# Patient Record
Sex: Female | Born: 1965
Health system: Southern US, Community
[De-identification: ages and names within clinical notes are randomized; demographics above are authoritative.]

## PROBLEM LIST (undated history)

## (undated) DIAGNOSIS — D649 Anemia, unspecified: Secondary | ICD-10-CM

## (undated) DIAGNOSIS — I10 Essential (primary) hypertension: Secondary | ICD-10-CM

## (undated) DIAGNOSIS — E119 Type 2 diabetes mellitus without complications: Secondary | ICD-10-CM

## (undated) DIAGNOSIS — E785 Hyperlipidemia, unspecified: Secondary | ICD-10-CM

## (undated) HISTORY — PX: CARPAL TUNNEL RELEASE: SHX101

## (undated) HISTORY — DX: Anemia, unspecified: D64.9

## (undated) HISTORY — PX: TONSILLECTOMY AND ADENOIDECTOMY: SUR1326

## (undated) HISTORY — DX: Hyperlipidemia, unspecified: E78.5

## (undated) HISTORY — PX: KNEE SURGERY: SHX244

## (undated) HISTORY — PX: MOUTH SURGERY: SHX715

## (undated) HISTORY — PX: BACK SURGERY: SHX140

---

## 1998-04-24 ENCOUNTER — Other Ambulatory Visit: Admission: RE | Admit: 1998-04-24 | Discharge: 1998-04-24 | Payer: Self-pay | Admitting: Obstetrics and Gynecology

## 1998-08-05 ENCOUNTER — Encounter: Admission: RE | Admit: 1998-08-05 | Discharge: 1998-11-03 | Payer: Self-pay | Admitting: Obstetrics and Gynecology

## 1998-12-07 ENCOUNTER — Inpatient Hospital Stay (HOSPITAL_COMMUNITY): Admission: AD | Admit: 1998-12-07 | Discharge: 1998-12-10 | Payer: Self-pay | Admitting: Obstetrics and Gynecology

## 1999-01-22 ENCOUNTER — Other Ambulatory Visit: Admission: RE | Admit: 1999-01-22 | Discharge: 1999-01-22 | Payer: Self-pay | Admitting: Obstetrics & Gynecology

## 2000-06-01 ENCOUNTER — Other Ambulatory Visit: Admission: RE | Admit: 2000-06-01 | Discharge: 2000-06-01 | Payer: Self-pay | Admitting: Obstetrics and Gynecology

## 2001-09-03 ENCOUNTER — Emergency Department (HOSPITAL_COMMUNITY): Admission: EM | Admit: 2001-09-03 | Discharge: 2001-09-03 | Payer: Self-pay

## 2001-12-08 ENCOUNTER — Encounter: Admission: RE | Admit: 2001-12-08 | Discharge: 2001-12-08 | Payer: Self-pay | Admitting: Family Medicine

## 2001-12-08 ENCOUNTER — Encounter: Payer: Self-pay | Admitting: Family Medicine

## 2002-10-19 ENCOUNTER — Other Ambulatory Visit: Admission: RE | Admit: 2002-10-19 | Discharge: 2002-10-19 | Payer: Self-pay | Admitting: Obstetrics and Gynecology

## 2003-03-04 ENCOUNTER — Encounter: Payer: Self-pay | Admitting: Family Medicine

## 2003-03-04 ENCOUNTER — Encounter: Admission: RE | Admit: 2003-03-04 | Discharge: 2003-03-04 | Payer: Self-pay | Admitting: Family Medicine

## 2003-04-03 ENCOUNTER — Encounter: Admission: RE | Admit: 2003-04-03 | Discharge: 2003-04-03 | Payer: Self-pay | Admitting: Family Medicine

## 2003-04-03 ENCOUNTER — Encounter: Payer: Self-pay | Admitting: Family Medicine

## 2003-11-13 ENCOUNTER — Other Ambulatory Visit: Admission: RE | Admit: 2003-11-13 | Discharge: 2003-11-13 | Payer: Self-pay | Admitting: Obstetrics and Gynecology

## 2004-11-24 ENCOUNTER — Other Ambulatory Visit: Admission: RE | Admit: 2004-11-24 | Discharge: 2004-11-24 | Payer: Self-pay | Admitting: Obstetrics and Gynecology

## 2006-01-24 ENCOUNTER — Other Ambulatory Visit: Admission: RE | Admit: 2006-01-24 | Discharge: 2006-01-24 | Payer: Self-pay | Admitting: Obstetrics and Gynecology

## 2009-03-26 ENCOUNTER — Encounter (INDEPENDENT_AMBULATORY_CARE_PROVIDER_SITE_OTHER): Payer: Self-pay | Admitting: Obstetrics and Gynecology

## 2009-03-26 ENCOUNTER — Ambulatory Visit (HOSPITAL_COMMUNITY): Admission: RE | Admit: 2009-03-26 | Discharge: 2009-03-26 | Payer: Self-pay | Admitting: Obstetrics and Gynecology

## 2010-02-05 ENCOUNTER — Ambulatory Visit (HOSPITAL_COMMUNITY): Admission: RE | Admit: 2010-02-05 | Discharge: 2010-02-07 | Payer: Self-pay | Admitting: Neurosurgery

## 2011-02-05 LAB — CBC
HCT: 42.8 % (ref 36.0–46.0)
Hemoglobin: 14.6 g/dL (ref 12.0–15.0)
MCHC: 34.1 g/dL (ref 30.0–36.0)
MCV: 90.4 fL (ref 78.0–100.0)
Platelets: 241 10*3/uL (ref 150–400)
RBC: 4.74 MIL/uL (ref 3.87–5.11)
RDW: 13.8 % (ref 11.5–15.5)
WBC: 4.8 10*3/uL (ref 4.0–10.5)

## 2011-02-05 LAB — SURGICAL PCR SCREEN
MRSA, PCR: NEGATIVE
Staphylococcus aureus: NEGATIVE

## 2011-02-05 LAB — BASIC METABOLIC PANEL
BUN: 13 mg/dL (ref 6–23)
CO2: 23 mEq/L (ref 19–32)
Calcium: 9.5 mg/dL (ref 8.4–10.5)
Chloride: 101 mEq/L (ref 96–112)
Creatinine, Ser: 0.9 mg/dL (ref 0.4–1.2)
GFR calc Af Amer: >60 mL/min (ref >60)
GFR calc non Af Amer: >60 mL/min (ref >60)
Glucose, Bld: 203 mg/dL — ABNORMAL HIGH (ref 70–99)
Potassium: 4.7 mEq/L (ref 3.5–5.1)
Sodium: 136 mEq/L (ref 135–145)

## 2011-02-05 LAB — GLUCOSE, CAPILLARY
Glucose-Capillary: 161 mg/dL — ABNORMAL HIGH (ref 70–99)
Glucose-Capillary: 217 mg/dL — ABNORMAL HIGH (ref 70–99)

## 2011-02-23 LAB — CBC
HCT: 40.7 % (ref 36.0–46.0)
Hemoglobin: 14.2 g/dL (ref 12.0–15.0)
MCHC: 34.9 g/dL (ref 30.0–36.0)
MCV: 93.8 fL (ref 78.0–100.0)
Platelets: 235 10*3/uL (ref 150–400)
RBC: 4.34 MIL/uL (ref 3.87–5.11)
RDW: 13.3 % (ref 11.5–15.5)
WBC: 6.2 10*3/uL (ref 4.0–10.5)

## 2011-02-23 LAB — PREGNANCY, URINE: Preg Test, Ur: NEGATIVE

## 2011-03-30 NOTE — Op Note (Signed)
NAMEMELVINE, Abigail Payne              ACCOUNT NO.:  [id number]   MEDICAL RECORD NO.:  [id number]          PATIENT TYPE:  AMB   LOCATION:  SDC                           FACILITY:  [facility]   PHYSICIAN:  Michelle L. Grewal, M.D.DATE OF BIRTH:  23-May-1966   DATE OF PROCEDURE:  03/26/2009  DATE OF DISCHARGE:  03/26/2009                               OPERATIVE REPORT   PREOPERATIVE DIAGNOSES:  Postmenopausal bleeding, cervical stenosis.   POSTOPERATIVE DIAGNOSES:  Postmenopausal bleeding, cervical stenosis.   PROCEDURE:  Dilation and curettage.   SURGEON:  Michelle L. Grewal, MD   ANESTHESIA:  MAC with paracervical.   ESTIMATED BLOOD LOSS:  Minimal.   COMPLICATIONS:  None.   PATHOLOGY:  Uterine curettings.   PROCEDURE IN DETAIL:  The patient was taken to the operating room.  Her  anesthesia was administered.  She was prepped and draped in the usual  sterile fashion.  A time-out was given and in-and-out catheter was used  to empty the bladder.  A speculum was inserted into the vagina.  The  cervix was grasped with a tenaculum and a paracervical block was  performed in a standard fashion.  I tried to insert a Pratt dilator and  the cervix was definitely stenotic.  I started with the smallest Pratt  dilator and noted that she had significant cervical stenosis, but then I  was able to dilate the cervix without difficulty.  After I dilated the  cervix, then I inserted a sharp curette and thoroughly curetted the  uterus and moderate amount of tissue x2.  All tissue was sent to  Pathology for analysis.  All instruments were removed from the vagina.  Minimal bleeding was noted.  All tissue will be sent for pathologic  analysis.  The patient tolerated the procedure well and she will go home  from recovery.      Michelle L. Vincente Poli, M.D.  Electronically Signed     MLG/MEDQ  D:  03/26/2009  T:  03/27/2009  Job:  [id number]

## 2011-08-20 ENCOUNTER — Ambulatory Visit: Payer: PRIVATE HEALTH INSURANCE | Admitting: Physical Medicine & Rehabilitation

## 2011-08-20 ENCOUNTER — Encounter: Payer: PRIVATE HEALTH INSURANCE | Attending: Physical Medicine & Rehabilitation

## 2012-03-29 ENCOUNTER — Other Ambulatory Visit: Payer: Self-pay | Admitting: Obstetrics and Gynecology

## 2012-09-05 ENCOUNTER — Other Ambulatory Visit: Payer: Self-pay | Admitting: Obstetrics and Gynecology

## 2012-10-16 ENCOUNTER — Other Ambulatory Visit: Payer: Self-pay | Admitting: Obstetrics and Gynecology

## 2013-04-17 ENCOUNTER — Other Ambulatory Visit: Payer: Self-pay | Admitting: Orthopedic Surgery

## 2013-04-17 DIAGNOSIS — M25511 Pain in right shoulder: Secondary | ICD-10-CM

## 2013-04-24 ENCOUNTER — Other Ambulatory Visit: Payer: Self-pay

## 2013-08-22 ENCOUNTER — Other Ambulatory Visit: Payer: Self-pay | Admitting: Obstetrics and Gynecology

## 2014-01-24 ENCOUNTER — Other Ambulatory Visit: Payer: Self-pay | Admitting: Physician Assistant

## 2014-01-24 DIAGNOSIS — S90859A Superficial foreign body, unspecified foot, initial encounter: Secondary | ICD-10-CM

## 2015-10-13 ENCOUNTER — Other Ambulatory Visit: Payer: Self-pay | Admitting: Family Medicine

## 2015-10-13 DIAGNOSIS — R4702 Dysphasia: Secondary | ICD-10-CM

## 2015-10-17 ENCOUNTER — Ambulatory Visit
Admission: RE | Admit: 2015-10-17 | Discharge: 2015-10-17 | Disposition: A | Discharge status: Home / Self Care | Payer: Medicaid Other | Source: Ambulatory Visit | Attending: Family Medicine | Admitting: Family Medicine

## 2015-10-17 DIAGNOSIS — R4702 Dysphasia: Secondary | ICD-10-CM

## 2018-03-04 ENCOUNTER — Other Ambulatory Visit: Payer: Self-pay

## 2018-03-04 ENCOUNTER — Emergency Department (HOSPITAL_COMMUNITY)
Admission: EM | Admit: 2018-03-04 | Discharge: 2018-03-04 | Disposition: A | Discharge status: Home / Self Care | Payer: Self-pay | Attending: Emergency Medicine | Admitting: Emergency Medicine

## 2018-03-04 ENCOUNTER — Encounter (HOSPITAL_COMMUNITY): Payer: Self-pay

## 2018-03-04 ENCOUNTER — Emergency Department (HOSPITAL_COMMUNITY): Payer: Self-pay

## 2018-03-04 DIAGNOSIS — E119 Type 2 diabetes mellitus without complications: Secondary | ICD-10-CM | POA: Insufficient documentation

## 2018-03-04 DIAGNOSIS — M25511 Pain in right shoulder: Secondary | ICD-10-CM | POA: Insufficient documentation

## 2018-03-04 DIAGNOSIS — I10 Essential (primary) hypertension: Secondary | ICD-10-CM | POA: Insufficient documentation

## 2018-03-04 HISTORY — DX: Essential (primary) hypertension: I10

## 2018-03-04 HISTORY — DX: Type 2 diabetes mellitus without complications: E11.9

## 2018-03-04 MED ORDER — METHOCARBAMOL 500 MG PO TABS: 500.0000 mg | ORAL_TABLET | Freq: Two times a day (BID) | ORAL | 0 refills | Status: DC

## 2018-03-04 NOTE — ED Triage Notes (Signed)
Patient complains of right shoulder pain since January after lifting heavy object at work, pain with ROM

## 2018-03-04 NOTE — Discharge Instructions (Signed)
You can take Tylenol or Ibuprofen as directed for pain. You can alternate Tylenol and Ibuprofen every 4 hours. If you take Tylenol at 1pm, then you can take Ibuprofen at 5pm. Then you can take Tylenol again at 9pm.   Take Robaxin as prescribed. This medication will make you drowsy so do not drive or drink alcohol when taking it.  As we discussed, you can wear the shoulder sling for support and stabilization.  Do not wear it 24/7.  At home, remove it and do gentle range of motion exercises to prevent frozen shoulder syndrome.  Follow-up with referred orthopedic doctor for further evaluation.  Follow-up with your primary care doctor or the referred [facility].  Blood pressure slightly elevated here today.  Have your blood pressure rechecked in 1 week.  Return to emergency department for any fevers, redness or swelling of the arm, numbness/weakness of the arm, chest pain, difficulty breathing or any other worsening or concerning symptoms.

## 2018-03-04 NOTE — ED Provider Notes (Signed)
[facility]   CSN: [id number] Arrival date & time: 03/04/18  0844     History   Chief Complaint No chief complaint on file.   HPI Abigail Payne is a 52 y.o. female past medical history is of DM, hypertension who presents for evaluation of right shoulder pain that is been ongoing since January 2019.  Patient reports that pain initially started at work.  She reports that she was moving a heavy box and states that she turned to move a heavy box with different table and started experiencing pain in right shoulder.  Patient reports that over the last several months, pain has become progressively worse.  She has not sought evaluation by primary care doctor for this pain.  She denies any other preceding trauma, injury, fall.  Patient reports she will intermittently take Tylenol for pain relief.  Patient reports she is also been intermittently using a sling which she states has helped.  Patient reports that there was no new or changes in symptoms which is "time to get it checked out."  She does not endorse a lot of overhead lifting at work.  Patient reports she still been able to work despite her symptoms.  Patient reports that she does have some pain that spreads to the trapezius muscle on the right side.  Patient denies any fevers, redness or swelling of the shoulder, numbness/weakness, chest pain, difficulty breathing.  The history is provided by the patient.    Past Medical History:  Diagnosis Date  . Diabetes mellitus without complication (HCC)   . Hypertension     There are no active problems to display for this patient.   History reviewed. No pertinent surgical history.   OB History   None      Home Medications    Prior to Admission medications   Medication Sig Start Date End Date Taking? Authorizing Provider  methocarbamol (ROBAXIN) 500 MG tablet Take 1 tablet (500 mg total) by mouth 2 (two) times daily. 03/04/18    Maxwell CaulLayden, Lindsey A, PA-C    Family History No family history on file.  Social History Social History   Tobacco Use  . Smoking status: Never Smoker  . Smokeless tobacco: Never Used  Substance Use Topics  . Alcohol use: Not on file  . Drug use: Not on file     Allergies   Patient has no known allergies.   Review of Systems Review of Systems  Constitutional: Negative for fever.  Cardiovascular: Negative for chest pain.  Musculoskeletal:       Right shoulder pain  Skin: Negative for color change.  Neurological: Negative for weakness and numbness.     Physical Exam Updated Vital Signs BP (!) 163/92 (BP Location: Left Arm)   Pulse 98   Temp 98.2 F (36.8 C) (Oral)   Resp 18   SpO2 96%   Physical Exam  Constitutional: She appears well-developed and well-nourished.  HENT:  Head: Normocephalic and atraumatic.  Eyes: Conjunctivae and EOM are normal. Right eye exhibits no discharge. Left eye exhibits no discharge. No scleral icterus.  Neck: Full passive range of motion without pain. Muscular tenderness present.  Full flexion/extension and lateral movement of neck fully intact.  Right-sided paraspinal tenderness noted to the cervical region.  No bony midline tenderness. No deformities or crepitus.  Positive Spurling's maneuver.  Cardiovascular: Normal rate, regular rhythm and normal pulses.  Pulses:      Radial pulses are 2+ on the right  side, and 2+ on the left side.  Pulmonary/Chest: Effort normal.  Musculoskeletal:  Diffuse tenderness palpation overlying the right shoulder.  No overlying warmth, erythema.  No deformity or crepitus noted.  Limited range of motion of shoulder secondary to pain.  Patient can get to approximately 100 degrees of flexion.  Additionally, she has approximately 100 to 110 degrees of abduction.  Positive Hawkins.  Unable to test Neer's impingement, liftoff test of right shoulder.  Positive Empty can test.  No tenderness palpation noted to left  shoulder.  Full range of motion of left shoulder without difficulty.  Negative Hawkins, Neer's impingement, liftoff, negative empty can test.  Neurological: She is alert.  Follows commands, Moves all extremities  Equal grip strength bilaterally Sensation intact throughout all major nerve distributions  Skin: Skin is warm and dry. Capillary refill takes less than 2 seconds.  Good distal cap refill. RUE is not dusky in appearance or cool to touch.  Psychiatric: She has a normal mood and affect. Her speech is normal and behavior is normal.  Nursing note and vitals reviewed.    ED Treatments / Results  Labs (all labs ordered are listed, but only abnormal results are displayed) Labs Reviewed - No data to display  EKG None  Radiology Dg Shoulder Right  Result Date: 03/04/2018 CLINICAL DATA:  Pt to ED for right shoulder ain since January. Pt states that she was lifting a heavy object at work in January and since then her shoulder has been hurting. Pain is all over with all ROM. EXAM: RIGHT SHOULDER - 2+ VIEW COMPARISON:  None. FINDINGS: There is no evidence of fracture or dislocation. Corticated ossicle at the superolateral margin of the acromion on. There is no other evidence of arthropathy or other focal bone abnormality. Soft tissues are unremarkable. IMPRESSION: Negative. Electronically Signed   By: Corlis Leak M.D.   On: 03/04/2018 09:52    Procedures Procedures (including critical care time)  Medications Ordered in ED Medications - No data to display   Initial Impression / Assessment and Plan / ED Course  I have reviewed the triage vital signs and the nursing notes.  Pertinent labs & imaging results that were available during my care of the patient were reviewed by me and considered in my medical decision making (see chart for details).     52 year old female past medical history of diabetes, hypertension who presents for evaluation of right shoulder pain that is been ongoing  since January 2018.  No new redness, swelling, numbness/weakness, fevers.  Has not had it evaluated.  Does report this happened at work after moving a heavy box.  Denies any other preceding trauma, injury. Patient is afebrile, non-toxic appearing, sitting comfortably on examination table. Vital signs reviewed and stable. Patient is neurovascularly intact.  Exam is concerning for radiculopathy pain versus rotator cuff pathology versus MSK injury/tendinitis.  Low suspicion for fracture or dislocation given history/physical exam.  History/physical exam is not concerning for CVA, ACS etiology.  history/physical exam is not concerning for DVT, septic arthritis, acute arterial embolism of the right upper extremity.  X-rays ordered at triage.  X-rays reviewed.  Negative for any acute fracture dislocation.  We will plan to treat as musculoskeletal pain.  Patient provided outpatient orthopedic follow-up for further evaluation.  Patient provided outpatient referral to wellness clinic for primary care follow-up.  Patient had ample opportunity for questions and discussion. All patient's questions were answered with full understanding. Strict return precautions discussed. Patient expresses understanding and agreement  to plan.   Final Clinical Impressions(s) / ED Diagnoses   Final diagnoses:  Acute pain of right shoulder    ED Discharge Orders        Ordered    methocarbamol (ROBAXIN) 500 MG tablet  2 times daily     03/04/18 0959       Maxwell Caul, PA-C 03/04/18 1029    Alvira Monday, MD 03/04/18 2342

## 2018-03-13 ENCOUNTER — Ambulatory Visit (INDEPENDENT_AMBULATORY_CARE_PROVIDER_SITE_OTHER): Payer: Self-pay | Admitting: Physician Assistant

## 2018-12-26 ENCOUNTER — Encounter: Payer: Self-pay | Admitting: Family Medicine

## 2018-12-26 ENCOUNTER — Ambulatory Visit (INDEPENDENT_AMBULATORY_CARE_PROVIDER_SITE_OTHER): Payer: BLUE CROSS/BLUE SHIELD | Admitting: Family Medicine

## 2018-12-26 VITALS — BP 150/96 | HR 92 | Ht 62.0 in | Wt 196.8 lb

## 2018-12-26 DIAGNOSIS — E1169 Type 2 diabetes mellitus with other specified complication: Secondary | ICD-10-CM | POA: Diagnosis not present

## 2018-12-26 DIAGNOSIS — Z9114 Patient's other noncompliance with medication regimen: Secondary | ICD-10-CM | POA: Insufficient documentation

## 2018-12-26 DIAGNOSIS — I152 Hypertension secondary to endocrine disorders: Secondary | ICD-10-CM

## 2018-12-26 DIAGNOSIS — Z7689 Persons encountering health services in other specified circumstances: Secondary | ICD-10-CM

## 2018-12-26 DIAGNOSIS — E782 Mixed hyperlipidemia: Secondary | ICD-10-CM | POA: Insufficient documentation

## 2018-12-26 DIAGNOSIS — I10 Essential (primary) hypertension: Secondary | ICD-10-CM

## 2018-12-26 DIAGNOSIS — E1159 Type 2 diabetes mellitus with other circulatory complications: Secondary | ICD-10-CM | POA: Diagnosis not present

## 2018-12-26 MED ORDER — BLOOD GLUCOSE METER KIT: PACK | 0 refills | Status: DC

## 2018-12-26 NOTE — Patient Instructions (Addendum)
[facility] blood pressure cuff recommended today.  Please obtain the cuff that measures on the arm.  Your goal blood pressure should be 135/85 or less on a regular basis, her medications should be started.  Normal blood pressure is 120/80 or less.       Hypertension Hypertension, commonly called high blood pressure, is when the force of blood pumping through the arteries is too strong. The arteries are the blood vessels that carry blood from the heart throughout the body. Hypertension forces the heart to work harder to pump blood and may cause arteries to become narrow or stiff. Having untreated or uncontrolled hypertension can cause heart attacks, strokes, kidney disease, and other problems. A blood pressure reading consists of a higher number over a lower number. Ideally, your blood pressure should be below 120/80. The first ("top") number is called the systolic pressure. It is a measure of the pressure in your arteries as your heart beats. The second ("bottom") number is called the diastolic pressure. It is a measure of the pressure in your arteries as the heart relaxes. What are the causes? The cause of this condition is not known. What increases the risk? Some risk factors for high blood pressure are under your control. Others are not. Factors you can change  Smoking.  Having type 2 diabetes mellitus, high cholesterol, or both.  Not getting enough exercise or physical activity.  Being overweight.  Having too much fat, sugar, calories, or salt (sodium) in your diet.  Drinking too much alcohol. Factors that are difficult or impossible to change  Having chronic kidney disease.  Having a family history of high blood pressure.  Age. Risk increases with age.  Race. You may be at higher risk if you are African-American.  Gender. Men are at higher risk than women before age 87. After age 10, women are at higher risk than men.  Having obstructive sleep apnea.  Stress. What are the  signs or symptoms? Extremely high blood pressure (hypertensive crisis) may cause:  Headache.  Anxiety.  Shortness of breath.  Nosebleed.  Nausea and vomiting.  Severe chest pain.  Jerky movements you cannot control (seizures).  How is this diagnosed? This condition is diagnosed by measuring your blood pressure while you are seated, with your arm resting on a surface. The cuff of the blood pressure monitor will be placed directly against the skin of your upper arm at the level of your heart. It should be measured at least twice using the same arm. Certain conditions can cause a difference in blood pressure between your right and left arms. Certain factors can cause blood pressure readings to be lower or higher than normal (elevated) for a short period of time:  When your blood pressure is higher when you are in a health care provider's office than when you are at home, this is called white coat hypertension. Most people with this condition do not need medicines.  When your blood pressure is higher at home than when you are in a health care provider's office, this is called masked hypertension. Most people with this condition may need medicines to control blood pressure.  If you have a high blood pressure reading during one visit or you have normal blood pressure with other risk factors:  You may be asked to return on a different day to have your blood pressure checked again.  You may be asked to monitor your blood pressure at home for 1 week or longer.  If you are diagnosed  with hypertension, you may have other blood or imaging tests to help your health care provider understand your overall risk for other conditions. How is this treated? This condition is treated by making healthy lifestyle changes, such as eating healthy foods, exercising more, and reducing your alcohol intake. Your health care provider may prescribe medicine if lifestyle changes are not enough to get your blood  pressure under control, and if:  Your systolic blood pressure is above 130.  Your diastolic blood pressure is above 80.  Your personal target blood pressure may vary depending on your medical conditions, your age, and other factors. Follow these instructions at home: Eating and drinking  Eat a diet that is high in fiber and potassium, and low in sodium, added sugar, and fat. An example eating plan is called the DASH (Dietary Approaches to Stop Hypertension) diet. To eat this way: ? Eat plenty of fresh fruits and vegetables. Try to fill half of your plate at each meal with fruits and vegetables. ? Eat whole grains, such as whole wheat pasta, brown rice, or whole grain bread. Fill about one quarter of your plate with whole grains. ? Eat or drink low-fat dairy products, such as skim milk or low-fat yogurt. ? Avoid fatty cuts of meat, processed or cured meats, and poultry with skin. Fill about one quarter of your plate with lean proteins, such as fish, chicken without skin, beans, eggs, and tofu. ? Avoid premade and processed foods. These tend to be higher in sodium, added sugar, and fat.  Reduce your daily sodium intake. Most people with hypertension should eat less than 1,500 mg of sodium a day.  Limit alcohol intake to no more than 1 drink a day for nonpregnant women and 2 drinks a day for men. One drink equals 12 oz of beer, 5 oz of wine, or 1 oz of hard liquor. Lifestyle  Work with your health care provider to maintain a healthy body weight or to lose weight. Ask what an ideal weight is for you.  Get at least 30 minutes of exercise that causes your heart to beat faster (aerobic exercise) most days of the week. Activities may include walking, swimming, or biking.  Include exercise to strengthen your muscles (resistance exercise), such as pilates or lifting weights, as part of your weekly exercise routine. Try to do these types of exercises for 30 minutes at least 3 days a week.  Do not  use any products that contain nicotine or tobacco, such as cigarettes and e-cigarettes. If you need help quitting, ask your health care provider.  Monitor your blood pressure at home as told by your health care provider.  Keep all follow-up visits as told by your health care provider. This is important. Medicines  Take over-the-counter and prescription medicines only as told by your health care provider. Follow directions carefully. Blood pressure medicines must be taken as prescribed.  Do not skip doses of blood pressure medicine. Doing this puts you at risk for problems and can make the medicine less effective.  Ask your health care provider about side effects or reactions to medicines that you should watch for. Contact a health care provider if:  You think you are having a reaction to a medicine you are taking.  You have headaches that keep coming back (recurring).  You feel dizzy.  You have swelling in your ankles.  You have trouble with your vision. Get help right away if:  You develop a severe headache or confusion.  You  have unusual weakness or numbness.  You feel faint.  You have severe pain in your chest or abdomen.  You vomit repeatedly.  You have trouble breathing. Summary  Hypertension is when the force of blood pumping through your arteries is too strong. If this condition is not controlled, it may put you at risk for serious complications.  Your personal target blood pressure may vary depending on your medical conditions, your age, and other factors. For most people, a normal blood pressure is less than 120/80.  Hypertension is treated with lifestyle changes, medicines, or a combination of both. Lifestyle changes include weight loss, eating a healthy, low-sodium diet, exercising more, and limiting alcohol. This information is not intended to replace advice given to you by your health care provider. Make sure you discuss any questions you have with your health  care provider. Document Released: 11/01/2005 Document Revised: 09/29/2016 Document Reviewed: 09/29/2016 Elsevier Interactive Patient Education  2018 [employer].    How to Take Your Blood Pressure   Blood pressure is a measurement of how strongly your blood is pressing against the walls of your arteries. Arteries are blood vessels that carry blood from your heart throughout your body. Your health care provider takes your blood pressure at each office visit. You can also take your own blood pressure at home with a blood pressure machine. You may need to take your own blood pressure:  To confirm a diagnosis of high blood pressure (hypertension).  To monitor your blood pressure over time.  To make sure your blood pressure medicine is working.  Supplies needed: To take your blood pressure, you will need a blood pressure machine. You can buy a blood pressure machine, or blood pressure monitor, at most drugstores or online. There are several types of home blood pressure monitors. When choosing one, consider the following:  Choose a monitor that has an arm cuff.  Choose a monitor that wraps snugly around your upper arm. You should be able to fit only one finger between your arm and the cuff.  Do not choose a monitor that measures your blood pressure from your wrist or finger.  Your health care provider can suggest a reliable monitor that will meet your needs. How to prepare To get the most accurate reading, avoid the following for 30 minutes before you check your blood pressure:  Drinking caffeine.  Drinking alcohol.  Eating.  Smoking.  Exercising.  Five minutes before you check your blood pressure:  Empty your bladder.  Sit quietly without talking in a dining chair, rather than in a soft couch or armchair.  How to take your blood pressure To check your blood pressure, follow the instructions in the manual that came with your blood pressure monitor. If you have a digital  blood pressure monitor, the instructions may be as follows: 1. Sit up straight. 2. Place your feet on the floor. Do not cross your ankles or legs. 3. Rest your left arm at the level of your heart on a table or desk or on the arm of a chair. 4. Pull up your shirt sleeve. 5. Wrap the blood pressure cuff around the upper part of your left arm, 1 inch (2.5 cm) above your elbow. It is best to wrap the cuff around bare skin. 6. Fit the cuff snugly around your arm. You should be able to place only one finger between the cuff and your arm. 7. Position the cord inside the groove of your elbow. 8. Press the power button.  9. Sit quietly while the cuff inflates and deflates. 10. Read the digital reading on the monitor screen and write it down (record it). 11. Wait 2-3 minutes, then repeat the steps, starting at step 1.  What does my blood pressure reading mean? A blood pressure reading consists of a higher number over a lower number. Ideally, your blood pressure should be below 120/80. The first ("top") number is called the systolic pressure. It is a measure of the pressure in your arteries as your heart beats. The second ("bottom") number is called the diastolic pressure. It is a measure of the pressure in your arteries as the heart relaxes. Blood pressure is classified into four stages. The following are the stages for adults who do not have a short-term serious illness or a chronic condition. Systolic pressure and diastolic pressure are measured in a unit called mm Hg. Normal  Systolic pressure: below 120.  Diastolic pressure: below 80. Elevated  Systolic pressure: 120-129.  Diastolic pressure: below 80. Hypertension stage 1  Systolic pressure: 130-139.  Diastolic pressure: 80-89. Hypertension stage 2  Systolic pressure: 140 or above.  Diastolic pressure: 90 or above. You can have prehypertension or hypertension even if only the systolic or only the diastolic number in your reading is  higher than normal. Follow these instructions at home:  Check your blood pressure as often as recommended by your health care provider.  Take your monitor to the next appointment with your health care provider to make sure: ? That you are using it correctly. ? That it provides accurate readings.  Be sure you understand what your goal blood pressure numbers are.  Tell your health care provider if you are having any side effects from blood pressure medicine. Contact a health care provider if:  Your blood pressure is consistently high. Get help right away if:  Your systolic blood pressure is higher than 180.  Your diastolic blood pressure is higher than 110. This information is not intended to replace advice given to you by your health care provider. Make sure you discuss any questions you have with your health care provider. Document Released: 04/09/2016 Document Revised: 06/22/2016 Document Reviewed: 04/09/2016 Elsevier Interactive Patient Education  2018 [employer]    Risk factors for prediabetes and type 2 diabetes  Researchers don't fully understand why some people develop prediabetes and type 2 diabetes and others don't.  It's clear that certain factors increase the risk, however, including:  Weight. The more fatty tissue you have, the more resistant your cells become to insulin.  Inactivity. The less active you are, the greater your risk. Physical activity helps you control your weight, uses up glucose as energy and makes your cells more sensitive to insulin.  Family history. Your risk increases if a parent or sibling has type 2 diabetes.  Race. Although it's unclear why, people of certain races -- including blacks, Hispanics, American Indians and Asian-Americans -- are at higher risk.  Age. Your risk increases as you get older. This may be because you tend to exercise less, lose muscle mass and gain weight as you age. But type 2 diabetes is also increasing dramatically  among children, adolescents and younger adults.  Gestational diabetes. If you developed gestational diabetes when you were pregnant, your risk of developing prediabetes and type 2 diabetes later increases. If you gave birth to a baby weighing more than 9 pounds (4 kilograms), you're also at risk of type 2 diabetes.  Polycystic ovary syndrome. For women, having polycystic ovary  syndrome -- a common condition characterized by irregular menstrual periods, excess hair growth and obesity -- increases the risk of diabetes.  High blood pressure. Having blood pressure over 140/90 millimeters of mercury (mm Hg) is linked to an increased risk of type 2 diabetes.  Abnormal cholesterol and triglyceride levels. If you have low levels of high-density lipoprotein (HDL), or "good," cholesterol, your risk of type 2 diabetes is higher. Triglycerides are another type of fat carried in the blood. People with high levels of triglycerides have an increased risk of type 2 diabetes. Your doctor can let you know what your cholesterol and triglyceride levels are.  A good guide to good carbs: The glycemic index ---If you have diabetes, or at risk for diabetes, you know all too well that when you eat carbohydrates, your blood sugar goes up. The total amount of carbs you consume at a meal or in a snack mostly determines what your blood sugar will do. But the food itself also plays a role. A serving of white rice has almost the same effect as eating pure table sugar -- a quick, high spike in blood sugar. A serving of lentils has a slower, smaller effect.  ---Picking good sources of carbs can help you control your blood sugar and your weight. Even if you don't have diabetes, eating healthier carbohydrate-rich foods can help ward off a host of chronic conditions, from heart disease to various cancers to, well, diabetes.  ---One way to choose foods is with the glycemic index (GI). This tool measures how much a food boosts blood sugar.   The glycemic index rates the effect of a specific amount of a food on blood sugar compared with the same amount of pure glucose. A food with a glycemic index of 28 boosts blood sugar only 28% as much as pure glucose. One with a GI of 95 acts like pure glucose.    High glycemic foods result in a quick spike in insulin and blood sugar (also known as blood glucose).  Low glycemic foods have a slower, smaller effect- these are healthier for you.   Using the glycemic index Using the glycemic index is easy: choose foods in the low GI category instead of those in the high GI category (see below), and go easy on those in between. Low glycemic index (GI of 55 or less): Most fruits and vegetables, beans, minimally processed grains, pasta, low-fat dairy foods, and nuts.  Moderate glycemic index (GI 56 to 69): White and sweet potatoes, corn, white rice, couscous, breakfast cereals such as Cream of Wheat and Mini Wheats.  High glycemic index (GI of 70 or higher): White bread, rice cakes, most crackers, bagels, cakes, doughnuts, croissants, most packaged breakfast cereals. You can see the values for 100 commons foods and get links to more at [email or web address].hu.  Swaps for lowering glycemic index  Instead of this high-glycemic index food Eat this lower-glycemic index food  White rice Brown rice or converted rice  Instant oatmeal Steel-cut oats  Cornflakes Bran flakes  Baked potato Pasta, bulgur  White bread Whole-grain bread  Corn Peas or leafy greens       Prediabetes Eating Plan  Prediabetes--also called impaired glucose tolerance or impaired fasting glucose--is a condition that causes blood sugar (blood glucose) levels to be higher than normal. Following a healthy diet can help to keep prediabetes under control. It can also help to lower the risk of type 2 diabetes and heart disease, which are increased in people who have prediabetes.  Along with regular exercise, a healthy  diet:  Promotes weight loss.  Helps to control blood sugar levels.  Helps to improve the way that the body uses insulin.   WHAT DO I NEED TO KNOW ABOUT THIS EATING PLAN?   Use the glycemic index (GI) to plan your meals. The index tells you how quickly a food will raise your blood sugar. Choose low-GI foods. These foods take a longer time to raise blood sugar.  Pay close attention to the amount of carbohydrates in the food that you eat. Carbohydrates increase blood sugar levels.  Keep track of how many calories you take in. Eating the right amount of calories will help you to achieve a healthy weight. Losing about 7 percent of your starting weight can help to prevent type 2 diabetes.  You may want to follow a Mediterranean diet. This diet includes a lot of vegetables, lean meats or fish, whole grains, fruits, and healthy oils and fats.   WHAT FOODS CAN I EAT?  Grains Whole grains, such as whole-wheat or whole-grain breads, crackers, cereals, and pasta. Unsweetened oatmeal. Bulgur. Barley. Quinoa. Brown rice. Corn or whole-wheat flour tortillas or taco shells. Vegetables Lettuce. Spinach. Peas. Beets. Cauliflower. Cabbage. Broccoli. Carrots. Tomatoes. Squash. Eggplant. Herbs. Peppers. Onions. Cucumbers. Brussels sprouts. Fruits Berries. Bananas. Apples. Oranges. Grapes. Papaya. Mango. Pomegranate. Kiwi. Grapefruit. Cherries. Meats and Other Protein Sources Seafood. Lean meats, such as chicken and [place] or lean cuts of pork and beef. Tofu. Eggs. Nuts. Beans. Dairy Low-fat or fat-free dairy products, such as yogurt, cottage cheese, and cheese. Beverages Water. Tea. Coffee. Sugar-free or diet soda. Seltzer water. Milk. Milk alternatives, such as soy or almond milk. Condiments Mustard. Relish. Low-fat, low-sugar ketchup. Low-fat, low-sugar barbecue sauce. Low-fat or fat-free mayonnaise. Sweets and Desserts Sugar-free or low-fat pudding. Sugar-free or low-fat ice cream and other  frozen treats. Fats and Oils Avocado. Walnuts. Olive oil. The items listed above may not be a complete list of recommended foods or beverages. Contact your dietitian for more options.    WHAT FOODS ARE NOT RECOMMENDED?  Grains Refined white flour and flour products, such as bread, pasta, snack foods, and cereals. Beverages Sweetened drinks, such as sweet iced tea and soda. Sweets and Desserts Baked goods, such as cake, cupcakes, pastries, cookies, and cheesecake. The items listed above may not be a complete list of foods and beverages to avoid. Contact your dietitian for more information.   This information is not intended to replace advice given to you by your health care provider. Make sure you discuss any questions you have with your health care provider.   Document Released: 03/18/2015 Document Reviewed: 03/18/2015 Elsevier Interactive Patient Education [employer]

## 2018-12-26 NOTE — Progress Notes (Signed)
New patient office visit note:  Impression and Recommendations:    1. Encounter to establish care with new doctor   2. Type 2 diabetes mellitus with other specified complication, unspecified whether long term insulin use ([place])   3. Hypertension associated with diabetes ([place])   4. Mixed diabetic hyperlipidemia associated with type 2 diabetes mellitus ([place])   5. BMI >er 35 with chronic conditions   6. Noncompliance with medication regimen-patient was without insurance and unable to afford medical care or medications many months until now     1. Encounter to Establish Care - Extensive discussion held with patient regarding establishing as a new patient.  Discussed policies and practices here at the clinic, and answered all questions about care team and health management during appointment.  - Discussed need for patient to continue to obtain management and screenings with all established specialists.  Educated patient at length about the critical importance of keeping health maintenance up to date.  - Participated in lengthy conversation and all questions were answered.  - Need for fasting blood work in near future.  2. Diabetes Mellitus - Per patient, has been taking leftover diabetes medications for the past week, and is down to her last 4 days' worth.  - Discussed that depending on A1c, medication will likely be started after lab work is drawn.  Reviewed recommendations at length with patient today.  - Counseled patient on pathophysiology of disease and discussed various treatment options, which often includes dietary and lifestyle modifications as first line.  Importance of low carb/ketogenic diet discussed with patient in addition to regular exercise.   - Encouraged patient to begin checking blood sugar again.  - Check FBS and 2 hours after the biggest meal of your day.  Keep log and bring in next OV for review.   Also, if you ever feel poorly, please check your blood  pressure and blood sugar, as one or the other could be the cause of your symptoms.  - Informational handout provided today.  3. Blood Pressure Elevated on Intake - Blood pressure elevated on intake. - Patient is not currently managed on treatment.  - Counseled patient on diagnosis of hypertension in office today.  Discussed critical need to control blood pressure, especially as a diabetic.  - Reviewed recommendations at length with patient today.  - Lifestyle changes such as dash diet and engaging in a regular exercise program discussed with patient.  Educational handouts provided.  - Ambulatory BP monitoring encouraged. Keep log and bring in next OV.  4. Nicotine Abuse & Cessation Counseling Told pt to think seriously about quitting vaping.  Told pt it is very important for his/her health and well being.   Vaping cessation instruction/counseling given:  counseled patient on the dangers of nicotine use, advised patient to stop vaping, and reviewed strategies to maximize success.  Discussed with patient that there are multiple treatments to aid in quitting vaping , however I explained none will work unless pt really want to quit   Education and routine counseling performed. Handouts provided.  5. BMI Counseling - BMI of 36.0 Explained to patient what BMI refers to, and what it means medically.    Told patient to think about it as a "medical risk stratification measurement" and how increasing BMI is associated with increasing risk/ or worsening state of various diseases such as hypertension, hyperlipidemia, diabetes, premature OA, depression etc.  American Heart Association guidelines for healthy diet, basically Mediterranean diet, and exercise guidelines of  30 minutes 5 days per week or more discussed in detail.  Health counseling performed.  All questions answered.  6. Lifestyle & Preventative Health Maintenance - Advised patient to continue working toward exercising to improve  overall mental, physical, and emotional health.    - Encouraged patient to engage in daily physical activity, especially a formal exercise routine.  Recommended that the patient eventually strive for at least 150 minutes of moderate cardiovascular activity per week according to guidelines established by the [facility].   - Healthy dietary habits encouraged, including low-carb, and high amounts of lean protein in diet.   - Patient should also consume adequate amounts of water.    Do not remove these below:  Orders Placed This Encounter  Procedures  . CBC with Differential/Platelet  . Comprehensive metabolic panel  . Hemoglobin A1c  . Lipid panel  . T4, free  . TSH  . VITAMIN D 25 Hydroxy (Vit-D Deficiency, Fractures)    Meds ordered this encounter  Medications  . blood glucose meter kit and supplies    Sig: Dispense based on patient and insurance preference. Use to check glucose fasting in the morning and then 2 hours after largest meal. (FOR ICD-10 E10.9, E11.9).    Dispense:  1 each    Refill:  0    Order Specific Question:   Number of strips    Answer:   100    Order Specific Question:   Number of lancets    Answer:   100    Medications Discontinued During This Encounter  Medication Reason  . methocarbamol (ROBAXIN) 500 MG tablet Completed Course      Gross side effects, risk and benefits, and alternatives of medications discussed with patient.  Patient is aware that all medications have potential side effects and we are unable to predict every side effect or drug-drug interaction that may occur.  Expresses verbal understanding and consents to current therapy plan and treatment regimen.  Return for 2)    Chronic OV w me near future & FBW 2-3d prior.  Please see AVS handed out to patient at the end of our visit for further patient instructions/ counseling done pertaining to today's office visit.    Note:  This document was prepared using Dragon voice recognition software and  may include unintentional dictation errors.   This document serves as a record of services personally performed by Mellody Dance, DO. It was created on her behalf by Toni Amend, a trained medical scribe. The creation of this record is based on the scribe's personal observations and the provider's statements to them.   I have reviewed the above medical documentation for accuracy and completeness and I concur.  Mellody Dance, DO 12/27/2018 9:57 PM       -----------------------------------------------------------------------------------------------------------------------------------    Subjective:    Chief complaint:   Chief Complaint  Patient presents with  . Establish Care    HPI: Abigail Payne is a pleasant 53 y.o. female who presents to [place] [facility] today to review their medical history with me and establish care.   I asked the patient to review their chronic problem list with me to ensure everything was updated and accurate.    All recent office visits with other providers, any medical records that patient brought in etc  - I reviewed today.     We asked pt to get [place] their medical records from [facility] providers/ specialists that they had seen within the past 3-5 years-  if they are in private practice and/or do not work for [employer], [facility], [place], [place] or [facility] owned practice.  Told them to call their specialists to clarify this if they are not sure.    Was formerly established with PCP through [facility].  Hasn't had insurance and therefore hasn't been to the doctor for a year.  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 Notes she's sedentary pretty much, managing a lot of paperwork.  Lives at home with 24 year old daughter. Was married in the past.  Not currently. Has  three cats.  Recently had oral surgery. Has been trying to eat low carb, low sugar, due to periodontist recommendations. Has cut out sugared cokes, and now drinking one diet coke per day. Made these changes a week ago.  Drinks about 3 cups of coffee/tea/soda per day.  States she doesn't exercise lately.  Tobacco Use Nicotine vaping. Patient would smoke now and then when she drank at a bar. Notes that she vapes with nicotine all day and night.  Never really smoked cigarettes, but has been vaping for 6 years.   EtOH Use Drinks Michelob Ultra.  Drinks 5 beers. Notes it averages out to 5 beers per night sometimes, especially given the weekend.  Family History Father had a heart attack at age 28.  Brother is relatively healthy.  States he has depression sometimes. Everything else is disability stuff through work.  Hypertension and diabetes run in her family.  Past Medical History Lost her health insurance about two years ago.  Hasn't had an cholesterol checks or been on medicines lately.  - Type II Diabetes Mellitus Started as gestational diabetes, about 22 years ago.  States she has been diabetic since around 2000.   Patient was off of metformin for a long time because she was exercising and got into great shape.  Notes "it went away because I got back in shape, but then I gained weight through the years, being married, and it came back."  Lost the weight again about 5 years ago and the diabetes "went away again," but it came back after that.  Feels that her last A1c check was 2 years ago or so.  She does not check her blood sugars.  Just resumed some leftover diabetes meds and is down to 4 days' worth.  Patient has no idea what her blood sugars are running.  States she feels tired a lot, and is experiencing excessive thirst and urination, more than usual.  Before resuming her leftover diabetes meds, she was getting up 5-6 times per night to urinate, now getting up only about  2-3 times per night.  Otherwise, patient is not managed on any needed medications.   - Hypertension Patient does not check her blood pressure at home.  She is not currently managed on medication.    Wt Readings from Last 3 Encounters:  12/26/18 196 lb 12.8 oz (89.3 kg)   BP Readings from Last 3 Encounters:  12/26/18 (!) 150/96  03/04/18 (!) 163/92   Pulse Readings from Last 3 Encounters:  12/26/18 92  03/04/18 98   BMI Readings from Last 3 Encounters:  12/26/18 36.00 kg/m    Patient Care Team    Relationship Specialty Notifications Start End  Mellody Dance, DO PCP - General Family Medicine  12/26/18     Patient Active Problem  List   Diagnosis Date Noted  . Mixed diabetic hyperlipidemia associated with type 2 diabetes mellitus ([place]) 12/26/2018  . Hypertension associated with diabetes ([place]) 12/26/2018  . Type 2 diabetes mellitus with other specified complication ([place]) 22/63/3354  . BMI >er 35 with chronic conditions 12/26/2018  . Noncompliance with medication regimen-patient was without insurance and unable to afford medical care or medications many months until now 12/26/2018       As reported by pt:  Past Medical History:  Diagnosis Date  . Diabetes mellitus without complication ([place])   . Hypertension      History reviewed. No pertinent surgical history.   Family History  Problem Relation Age of Onset  . Diabetes Mother   . Hyperlipidemia Mother   . Hypertension Mother   . Heart attack Father   . Diabetes Father   . Hyperlipidemia Father   . Hypertension Father      Social History   Substance and Sexual Activity  Drug Use Never     Social History   Substance and Sexual Activity  Alcohol Use Yes   Comment: 5 beers daily on an avg     Social History   Tobacco Use  Smoking Status Never Smoker  Smokeless Tobacco Never Used     Current Meds  Medication Sig  . amoxicillin-clavulanate (AUGMENTIN) 875-125 MG tablet Take 1 tablet by  mouth 2 (two) times daily.  . metFORMIN (GLUCOPHAGE) 500 MG tablet Take 1,000 mg by mouth 2 (two) times daily with a meal.    Allergies: Patient has no known allergies.   Review of Systems  Constitutional: Negative for chills, diaphoresis, fever, malaise/fatigue and weight loss.  HENT: Negative for congestion, sore throat and tinnitus.   Eyes: Negative for blurred vision, double vision and photophobia.  Respiratory: Negative for cough and wheezing.   Cardiovascular: Negative for chest pain and palpitations.  Gastrointestinal: Negative for blood in stool, diarrhea, nausea and vomiting.  Genitourinary: Negative for dysuria, frequency and urgency.  Musculoskeletal: Negative for joint pain and myalgias.  Skin: Negative for itching and rash.  Neurological: Negative for dizziness, focal weakness, weakness and headaches.  Endo/Heme/Allergies: Negative for environmental allergies and polydipsia. Does not bruise/bleed easily.  Psychiatric/Behavioral: Negative for depression and memory loss. The patient is not nervous/anxious and does not have insomnia.       Objective:   Blood pressure (!) 150/96, pulse 92, height 5' 2"  (1.575 m), weight 196 lb 12.8 oz (89.3 kg), SpO2 98 %. Body mass index is 36 kg/m. General: Well Developed, well nourished, and in no acute distress.  Neuro: Alert and oriented x3, extra-ocular muscles intact, sensation grossly intact.  HEENT:[place]/AT, PERRLA, neck supple, No carotid bruits Skin: no gross rashes  Cardiac: Regular rate and rhythm Respiratory: Essentially clear to auscultation bilaterally. Not using accessory muscles, speaking in full sentences.  Abdominal: not grossly distended Musculoskeletal: Ambulates w/o diff, FROM * 4 ext.  Vasc: less 2 sec cap RF, warm and pink  Psych:  No HI/SI, judgement and insight good, Euthymic mood. Full Affect.    No results found for this or any previous visit (from the past 2160 hour(s)).

## 2019-01-08 ENCOUNTER — Other Ambulatory Visit: Payer: Self-pay

## 2019-01-09 ENCOUNTER — Other Ambulatory Visit: Payer: Self-pay | Admitting: Family Medicine

## 2019-01-09 ENCOUNTER — Other Ambulatory Visit: Payer: BLUE CROSS/BLUE SHIELD

## 2019-01-09 DIAGNOSIS — E782 Mixed hyperlipidemia: Secondary | ICD-10-CM | POA: Diagnosis not present

## 2019-01-09 DIAGNOSIS — E1159 Type 2 diabetes mellitus with other circulatory complications: Secondary | ICD-10-CM | POA: Diagnosis not present

## 2019-01-09 DIAGNOSIS — I10 Essential (primary) hypertension: Secondary | ICD-10-CM | POA: Diagnosis not present

## 2019-01-09 DIAGNOSIS — E1169 Type 2 diabetes mellitus with other specified complication: Secondary | ICD-10-CM | POA: Diagnosis not present

## 2019-01-09 DIAGNOSIS — Z9114 Patient's other noncompliance with medication regimen: Secondary | ICD-10-CM

## 2019-01-09 DIAGNOSIS — Z7689 Persons encountering health services in other specified circumstances: Secondary | ICD-10-CM

## 2019-01-09 DIAGNOSIS — I152 Hypertension secondary to endocrine disorders: Secondary | ICD-10-CM

## 2019-01-10 LAB — CBC WITH DIFFERENTIAL/PLATELET
Basophils Absolute: 0 10*3/uL (ref 0.0–0.2)
Basos: 0 %
EOS (ABSOLUTE): 0.3 10*3/uL (ref 0.0–0.4)
Eos: 5 %
Hematocrit: 43.8 % (ref 34.0–46.6)
Hemoglobin: 14.7 g/dL (ref 11.1–15.9)
Immature Grans (Abs): 0 10*3/uL (ref 0.0–0.1)
Immature Granulocytes: 0 %
Lymphocytes Absolute: 1.8 10*3/uL (ref 0.7–3.1)
Lymphs: 36 %
MCH: 30.8 pg (ref 26.6–33.0)
MCHC: 33.6 g/dL (ref 31.5–35.7)
MCV: 92 fL (ref 79–97)
Monocytes Absolute: 0.3 10*3/uL (ref 0.1–0.9)
Monocytes: 7 %
Neutrophils Absolute: 2.5 10*3/uL (ref 1.4–7.0)
Neutrophils: 52 %
Platelets: 272 10*3/uL (ref 150–450)
RBC: 4.77 x10E6/uL (ref 3.77–5.28)
RDW: 12.3 % (ref 11.7–15.4)
WBC: 4.9 10*3/uL (ref 3.4–10.8)

## 2019-01-10 LAB — LIPID PANEL
Chol/HDL Ratio: 4.6 ratio — ABNORMAL HIGH (ref 0.0–4.4)
Cholesterol, Total: 269 mg/dL — ABNORMAL HIGH (ref 100–199)
HDL: 59 mg/dL (ref >39)
LDL Calculated: 173 mg/dL — ABNORMAL HIGH (ref 0–99)
Triglycerides: 183 mg/dL — ABNORMAL HIGH (ref 0–149)
VLDL Cholesterol Cal: 37 mg/dL (ref 5–40)

## 2019-01-10 LAB — COMPREHENSIVE METABOLIC PANEL
ALT: 54 IU/L — ABNORMAL HIGH (ref 0–32)
AST: 31 IU/L (ref 0–40)
Albumin/Globulin Ratio: 1.6 (ref 1.2–2.2)
Albumin: 4.7 g/dL (ref 3.8–4.9)
Alkaline Phosphatase: 141 IU/L — ABNORMAL HIGH (ref 39–117)
BUN/Creatinine Ratio: 14 (ref 9–23)
BUN: 10 mg/dL (ref 6–24)
Bilirubin Total: 0.5 mg/dL (ref 0.0–1.2)
CO2: 25 mmol/L (ref 20–29)
Calcium: 9.8 mg/dL (ref 8.7–10.2)
Chloride: 96 mmol/L (ref 96–106)
Creatinine, Ser: 0.73 mg/dL (ref 0.57–1.00)
GFR calc Af Amer: 110 mL/min/{1.73_m2} (ref >59)
GFR calc non Af Amer: 95 mL/min/{1.73_m2} (ref >59)
Globulin, Total: 2.9 g/dL (ref 1.5–4.5)
Glucose: 244 mg/dL — ABNORMAL HIGH (ref 65–99)
Potassium: 5 mmol/L (ref 3.5–5.2)
Sodium: 136 mmol/L (ref 134–144)
Total Protein: 7.6 g/dL (ref 6.0–8.5)

## 2019-01-10 LAB — HEMOGLOBIN A1C
Est. average glucose Bld gHb Est-mCnc: 286 mg/dL
Hgb A1c MFr Bld: 11.6 % — ABNORMAL HIGH (ref 4.8–5.6)

## 2019-01-10 LAB — T4, FREE: Free T4: 1.05 ng/dL (ref 0.82–1.77)

## 2019-01-10 LAB — VITAMIN D 25 HYDROXY (VIT D DEFICIENCY, FRACTURES): Vit D, 25-Hydroxy: 25 ng/mL — ABNORMAL LOW (ref 30.0–100.0)

## 2019-01-10 LAB — TSH: TSH: 1.46 u[IU]/mL (ref 0.450–4.500)

## 2019-01-11 ENCOUNTER — Ambulatory Visit: Payer: BLUE CROSS/BLUE SHIELD | Admitting: Family Medicine

## 2019-01-11 ENCOUNTER — Encounter: Payer: Self-pay | Admitting: Family Medicine

## 2019-01-11 VITALS — BP 140/94 | HR 78 | Temp 98.8°F | Ht 62.0 in | Wt 195.0 lb

## 2019-01-11 DIAGNOSIS — Z9114 Patient's other noncompliance with medication regimen: Secondary | ICD-10-CM | POA: Diagnosis not present

## 2019-01-11 DIAGNOSIS — E559 Vitamin D deficiency, unspecified: Secondary | ICD-10-CM

## 2019-01-11 DIAGNOSIS — F17200 Nicotine dependence, unspecified, uncomplicated: Secondary | ICD-10-CM

## 2019-01-11 DIAGNOSIS — I152 Hypertension secondary to endocrine disorders: Secondary | ICD-10-CM

## 2019-01-11 DIAGNOSIS — F1010 Alcohol abuse, uncomplicated: Secondary | ICD-10-CM

## 2019-01-11 DIAGNOSIS — E1169 Type 2 diabetes mellitus with other specified complication: Secondary | ICD-10-CM

## 2019-01-11 DIAGNOSIS — I10 Essential (primary) hypertension: Secondary | ICD-10-CM

## 2019-01-11 DIAGNOSIS — Z91148 Patient's other noncompliance with medication regimen for other reason: Secondary | ICD-10-CM

## 2019-01-11 DIAGNOSIS — R748 Abnormal levels of other serum enzymes: Secondary | ICD-10-CM

## 2019-01-11 DIAGNOSIS — E782 Mixed hyperlipidemia: Secondary | ICD-10-CM

## 2019-01-11 DIAGNOSIS — E1159 Type 2 diabetes mellitus with other circulatory complications: Secondary | ICD-10-CM

## 2019-01-11 LAB — POCT UA - MICROALBUMIN
Creatinine, POC: 50 mg/dL
Microalbumin Ur, POC: 10 mg/L

## 2019-01-11 MED ORDER — ATORVASTATIN CALCIUM 80 MG PO TABS: ORAL_TABLET | ORAL | 1 refills | Status: DC

## 2019-01-11 MED ORDER — LIRAGLUTIDE 18 MG/3ML ~~LOC~~ SOPN: PEN_INJECTOR | SUBCUTANEOUS | 1 refills | Status: DC

## 2019-01-11 MED ORDER — VITAMIN D (ERGOCALCIFEROL) 1.25 MG (50000 UNIT) PO CAPS: ORAL_CAPSULE | ORAL | 1 refills | Status: DC

## 2019-01-11 MED ORDER — OLMESARTAN-AMLODIPINE-HCTZ 20-5-12.5 MG PO TABS: ORAL_TABLET | ORAL | 0 refills | Status: DC

## 2019-01-11 MED ORDER — METFORMIN HCL 1000 MG PO TABS: 1000.0000 mg | ORAL_TABLET | Freq: Two times a day (BID) | ORAL | 1 refills | Status: DC

## 2019-01-11 NOTE — Progress Notes (Signed)
Assessment and plan:  1. Type 2 diabetes mellitus with other specified complication, unspecified whether long term insulin use ([name])   2. Hypertension associated with diabetes ([name])   3. Mixed diabetic hyperlipidemia associated with type 2 diabetes mellitus ([name])   4. BMI >er 35 with chronic conditions   5. Noncompliance with medication regimen-patient was without insurance and unable to afford medical care or medications many months until now   6. Elevated liver enzymes   7. Excessive drinking alcohol   8. Vitamin D deficiency   9. Smoker     1. Reviewed recent lab work (01/09/2019) in depth with patient today.  All lab work within normal limits unless otherwise noted.  Extensive education was provided and all questions were answered.  2. Type Two Diabetes Mellitus, Uncontrolled - HbA1c was 11.6. - Fasting blood sugar during lab check was 244. - Reviewed that, as a diabetic, fasting sugars should be less than 130.  - Recommended beginning medications today.  - Per patient, was on metformin in the past.   - Discussed dosage and weaning to prevent S-E. - Begin metformin today, 1000 twice daily.  - Begin Victoza. - Extensive education provided.  - Discussed that according to guidelines, anybody with an A1c above 9.0 is advised to go on insulin. - After patient adjusts to new medications, discussed incorporating insulin in addition to other treatment. - Will re-evaluate patient when she returns in 6 weeks.  - Obtain a glucometer for use at home.  Patient knows to remain vigilant and keep an eye on how her sugars are going down.  - Counseled patient on pathophysiology of disease and discussed various treatment options, which often includes dietary and lifestyle modifications as first line.  Importance of low carb/ketogenic diet discussed with patient in addition to regular exercise.   - Check FBS and 2 hours after  the biggest meal of your day.  Keep log and bring in next OV for my review.   Also, if you ever feel poorly, please check your blood pressure and blood sugar, as one or the other could be the cause of your symptoms.  - Being a diabetic, you need yearly eye and foot exams. Make appt.for diabetic eye exam.  3. Hypertension Associated with Diabetes Mellitus - Not controlled at this time. - Begin medication today.  See med list below.  - Lifestyle changes such as dash diet and engaging in a regular exercise program discussed with patient.  Educational handouts provided  - Ongoing ambulatory BP monitoring encouraged. Keep log and bring in next OV.  4. Mixed Diabetic Hyperlipidemia, Hypertriglyceridemia Triglycerides = 183, elevated. LDL = 173, elevated. HDL = 59.  - Begin statin today. - Strongly advised patient to exercise and adequately hydrate to minimize side-effects of Lipitor.  - Dietary changes such as low saturated & trans fat and low carb/high protein diets discussed with patient.  Encouraged regular exercise and weight loss when appropriate.   - Advised patient to increase her HDL to 60+ though vigorous physical activity.  - Educational handouts provided at patient's desire.  The 10-year ASCVD risk score Mikey Bussing DC Jr., et al., 2013) is: 4.6%   Values used to calculate the score:     Age: 53 years     Sex: Female     Is Non-Hispanic African American: No     Diabetic: Yes     Tobacco smoker: No     Systolic Blood Pressure: 428 mmHg  Is BP treated: No     HDL Cholesterol: 59 mg/dL     Total Cholesterol: 269 mg/dL  5. Elevated Liver Enzymes - ALT of 54 - Discussed that patient's elevated ALT is likely due to fatty liver infiltrate.  - Encouraged patient to cut back on alcohol intake and increase physical activity.  6. Vitamin D Deficiency  - 25.0 last check. - Discussed goal as 50-60. - Begin supplementation, once weekly prescription, and OTC daily.  - Re-check in 6  weeks.  7. BMI Counseling - BMI of 35.67 Explained to patient what BMI refers to, and what it means medically.    Told patient to think about it as a "medical risk stratification measurement" and how increasing BMI is associated with increasing risk/ or worsening state of various diseases such as hypertension, hyperlipidemia, diabetes, premature OA, depression etc.  American Heart Association guidelines for healthy diet, basically Mediterranean diet, and exercise guidelines of 30 minutes 5 days per week or more discussed in detail.  Health counseling performed.  All questions answered.  8. Lifestyle & Preventative Health Maintenance - Advised patient to continue working toward exercising to improve overall mental, physical, and emotional health.    - Reviewed the "spokes of the wheel" of mood and health management.  Stressed the importance of ongoing prudent habits, including regular exercise, appropriate sleep hygiene, healthful dietary habits, and prayer/meditation to relax.  - Encouraged patient to engage in daily physical activity, especially a formal exercise routine.  Recommended that the patient eventually strive for at least 150 minutes of moderate cardiovascular activity per week according to guidelines established by the [facility].   - Healthy dietary habits encouraged, including low-carb, and high amounts of lean protein in diet.   - Patient should also consume adequate amounts of water.   Education and routine counseling performed. Handouts provided.  Pt was interviewed and evaluated by me in the clinic today for 32.5+ minutes, with over 50% time spent in face to face counseling of patients various medical conditions, treatment plans of those medical conditions including medicine management and lifestyle modification, strategies to improve health and well being; and in coordination of care. SEE ABOVE TREATMENT PLAN FOR DETAILS    Orders Placed This Encounter  Procedures  . POCT UA  - Microalbumin    Meds ordered this encounter  Medications  . atorvastatin (LIPITOR) 80 MG tablet    Sig: 1/2 tab q hs for 6 days, then 1 po q hs    Dispense:  90 tablet    Refill:  1  . Olmesartan-amLODIPine-HCTZ 20-5-12.5 MG TABS    Sig: Take 0.5 tablets by mouth daily for 6 days, THEN 1 tablet daily.    Dispense:  90 tablet    Refill:  0  . metFORMIN (GLUCOPHAGE) 1000 MG tablet    Sig: Take 1 tablet (1,000 mg total) by mouth 2 (two) times daily with a meal.    Dispense:  180 tablet    Refill:  1  . liraglutide (VICTOZA) 18 MG/3ML SOPN    Sig: Started 0.6 mg injected subcutaneously daily for one week, then increase by 0.6 mg weekly until reaching 1.8 mg injected daily.  Please include needles.    Dispense:  9 mL    Refill:  1  . Vitamin D, Ergocalciferol, (DRISDOL) 1.25 MG (50000 UT) CAPS capsule    Sig: Take one tablet wkly    Dispense:  12 capsule    Refill:  1    Medications Discontinued During  This Encounter  Medication Reason  . amoxicillin-clavulanate (AUGMENTIN) 875-125 MG tablet Completed Course  . blood glucose meter kit and supplies Completed Course  . metFORMIN (GLUCOPHAGE) 500 MG tablet Completed Course      Return for 2) f/up 6 wks- lab for CMP, vit D, A1c, then OV 3d later w me- started a bunch of new meds.   Anticipatory guidance and routine counseling done re: condition, txmnt options and need for follow up. All questions of patient's were answered.   Gross side effects, risk and benefits, and alternatives of medications discussed with patient.  Patient is aware that all medications have potential side effects and we are unable to predict every sideeffect or drug-drug interaction that may occur.  Expresses verbal understanding and consents to current therapy plan and treatment regiment.  Please see AVS handed out to patient at the end of our visit for additional patient instructions/ counseling done pertaining to today's office visit.  Note:  This  document was prepared using Dragon voice recognition software and may include unintentional dictation errors.  This document serves as a record of services personally performed by Mellody Dance, DO. It was created on her behalf by Toni Amend, a trained medical scribe. The creation of this record is based on the scribe's personal observations and the provider's statements to them.   I have reviewed the above medical documentation for accuracy and completeness and I concur.  Mellody Dance, DO 01/12/2019 6:08 PM      ----------------------------------------------------------------------------------------------------------------------  Subjective:   CC:   Abigail Payne is a 53 y.o. female who presents to [place] at [facility] today for review and discussion of recent bloodwork that was done in addition to f/up on chronic conditions we are managing for pt.  1. All recent blood work that we ordered was reviewed with patient today.  Patient was counseled on all abnormalities and we discussed dietary and lifestyle changes that could help those values (also medications when appropriate).  Extensive health counseling performed and all patient's concerns/ questions were addressed.  See labs below and also plan for more details of these abnormalities  Has cut back carbs and stopped drinking sodas; notes she has been eating no potatoes, breads, french fries, or pastas.  Patient does not have a glucometer at home and has not been checking her blood sugars.  Drinks between 3-5 Michelob Ultras per day.  DM HPI:  Feels that the worst she's ever seen her A1c in the past was around 7.0.  -  She has been working on diet, cutting carbs.  Patient is currently un-medicated.  She has not been checking her blood sugars at home.   Denies polyuria/polydipsia.  Denies hypo/ hyperglycemia symptoms  Last diabetic eye exam was No results found for: HMDIABEYEEXA  Last A1C in  the office was:  Lab Results  Component Value Date   HGBA1C 11.6 (H) 01/09/2019    Lab Results  Component Value Date   MICROALBUR 10 01/11/2019   LDLCALC 173 (H) 01/09/2019   CREATININE 0.73 01/09/2019    Wt Readings from Last 3 Encounters:  01/11/19 195 lb (88.5 kg)  12/26/18 196 lb 12.8 oz (89.3 kg)    1. HTN HPI:  -  Her blood pressure has not been controlled at home.  Pt is checking it at home.  Notes her BP has been running high, around 145-160/100 at home.   - Smoking Status noted   - She denies new onset of: chest  pain, exercise intolerance, shortness of breath, dizziness, visual changes, headache, lower extremity swelling or claudication.   Last 3 blood pressure readings in our office are as follows: BP Readings from Last 3 Encounters:  01/11/19 (!) 140/94  12/26/18 (!) 150/96  03/04/18 (!) 163/92    Filed Weights   01/11/19 0939  Weight: 195 lb (88.5 kg)     Wt Readings from Last 3 Encounters:  01/11/19 195 lb (88.5 kg)  12/26/18 196 lb 12.8 oz (89.3 kg)   BP Readings from Last 3 Encounters:  01/11/19 (!) 140/94  12/26/18 (!) 150/96  03/04/18 (!) 163/92   Pulse Readings from Last 3 Encounters:  01/11/19 78  12/26/18 92  03/04/18 98   BMI Readings from Last 3 Encounters:  01/11/19 35.67 kg/m  12/26/18 36.00 kg/m     Patient Care Team    Relationship Specialty Notifications Start End  Mellody Dance, DO PCP - General Family Medicine  12/26/18     Full medical history updated and reviewed in the office today  Patient Active Problem List   Diagnosis Date Noted  . Persistent microalbuminuria associated with type 2 diabetes mellitus ([place]) 01/12/2019  . Elevated liver enzymes 01/11/2019  . Vitamin D deficiency 01/11/2019  . Smoker 01/11/2019  . Excessive drinking alcohol 01/11/2019  . Mixed diabetic hyperlipidemia associated with type 2 diabetes mellitus ([place]) 12/26/2018  . Hypertension associated with diabetes ([place]) 12/26/2018  . Type 2  diabetes mellitus with other specified complication ([place]) 35/32/9924  . BMI >er 35 with chronic conditions 12/26/2018  . Noncompliance with medication regimen-patient was without insurance and unable to afford medical care or medications many months until now 12/26/2018    Past Medical History:  Diagnosis Date  . Diabetes mellitus without complication ([place])   . Hypertension     History reviewed. No pertinent surgical history.  Social History   Tobacco Use  . Smoking status: Never Smoker  . Smokeless tobacco: Never Used  Substance Use Topics  . Alcohol use: Yes    Comment: 5 beers daily on an avg    Family Hx: Family History  Problem Relation Age of Onset  . Diabetes Mother   . Hyperlipidemia Mother   . Hypertension Mother   . Heart attack Father   . Diabetes Father   . Hyperlipidemia Father   . Hypertension Father      Medications: Current Outpatient Medications  Medication Sig Dispense Refill  . atorvastatin (LIPITOR) 80 MG tablet 1/2 tab q hs for 6 days, then 1 po q hs 90 tablet 1  . liraglutide (VICTOZA) 18 MG/3ML SOPN Started 0.6 mg injected subcutaneously daily for one week, then increase by 0.6 mg weekly until reaching 1.8 mg injected daily.  Please include needles. 9 mL 1  . metFORMIN (GLUCOPHAGE) 1000 MG tablet Take 1 tablet (1,000 mg total) by mouth 2 (two) times daily with a meal. 180 tablet 1  . Olmesartan-amLODIPine-HCTZ 20-5-12.5 MG TABS Take 0.5 tablets by mouth daily for 6 days, THEN 1 tablet daily. 90 tablet 0  . Vitamin D, Ergocalciferol, (DRISDOL) 1.25 MG (50000 UT) CAPS capsule Take one tablet wkly 12 capsule 1   No current facility-administered medications for this visit.     Allergies:  No Known Allergies   Review of Systems: General:   No F/C, wt loss Pulm:   No DIB, SOB, pleuritic chest pain Card:  No CP, palpitations Abd:  No n/v/d or pain Ext:  No inc edema from baseline  Objective:  Blood pressure (!) 140/94, pulse 78, temperature  98.8 F (37.1 C), height 5' 2"  (1.575 m), weight 195 lb (88.5 kg), SpO2 99 %. Body mass index is 35.67 kg/m. Gen:   Well NAD, A and O *3 HEENT:    [place]/AT, EOMI,  MMM Lungs:   Normal work of breathing. CTA B/L, no Wh, rhonchi Heart:   RRR, S1, S2 WNL's, no MRG Abd:   No gross distention Exts:    warm, pink,  Brisk capillary refill, warm and well perfused.  Psych:    No HI/SI, judgement and insight good, Euthymic mood. Full Affect.   Recent Results (from the past 2160 hour(s))  CBC with Differential/Platelet     Status: None   Collection Time: 01/09/19  9:43 AM  Result Value Ref Range   WBC 4.9 3.4 - 10.8 x10E3/uL   RBC 4.77 3.77 - 5.28 x10E6/uL   Hemoglobin 14.7 11.1 - 15.9 g/dL   Hematocrit 43.8 34.0 - 46.6 %   MCV 92 79 - 97 fL   MCH 30.8 26.6 - 33.0 pg   MCHC 33.6 31.5 - 35.7 g/dL   RDW 12.3 11.7 - 15.4 %   Platelets 272 150 - 450 x10E3/uL   Neutrophils 52 Not Estab. %   Lymphs 36 Not Estab. %   Monocytes 7 Not Estab. %   Eos 5 Not Estab. %   Basos 0 Not Estab. %   Neutrophils Absolute 2.5 1.4 - 7.0 x10E3/uL   Lymphocytes Absolute 1.8 0.7 - 3.1 x10E3/uL   Monocytes Absolute 0.3 0.1 - 0.9 x10E3/uL   EOS (ABSOLUTE) 0.3 0.0 - 0.4 x10E3/uL   Basophils Absolute 0.0 0.0 - 0.2 x10E3/uL   Immature Granulocytes 0 Not Estab. %   Immature Grans (Abs) 0.0 0.0 - 0.1 x10E3/uL  Comprehensive metabolic panel     Status: Abnormal   Collection Time: 01/09/19  9:43 AM  Result Value Ref Range   Glucose 244 (H) 65 - 99 mg/dL   BUN 10 6 - 24 mg/dL   Creatinine, Ser 0.73 0.57 - 1.00 mg/dL   GFR calc non Af Amer 95 >59 mL/min/1.73   GFR calc Af Amer 110 >59 mL/min/1.73   BUN/Creatinine Ratio 14 9 - 23   Sodium 136 134 - 144 mmol/L   Potassium 5.0 3.5 - 5.2 mmol/L   Chloride 96 96 - 106 mmol/L   CO2 25 20 - 29 mmol/L   Calcium 9.8 8.7 - 10.2 mg/dL   Total Protein 7.6 6.0 - 8.5 g/dL   Albumin 4.7 3.8 - 4.9 g/dL    Comment:               **Please note reference interval change**    Globulin, Total 2.9 1.5 - 4.5 g/dL   Albumin/Globulin Ratio 1.6 1.2 - 2.2   Bilirubin Total 0.5 0.0 - 1.2 mg/dL   Alkaline Phosphatase 141 (H) 39 - 117 IU/L   AST 31 0 - 40 IU/L   ALT 54 (H) 0 - 32 IU/L  Lipid panel     Status: Abnormal   Collection Time: 01/09/19  9:43 AM  Result Value Ref Range   Cholesterol, Total 269 (H) 100 - 199 mg/dL   Triglycerides 183 (H) 0 - 149 mg/dL   HDL 59 >39 mg/dL   VLDL Cholesterol Cal 37 5 - 40 mg/dL   LDL Calculated 173 (H) 0 - 99 mg/dL   Chol/HDL Ratio 4.6 (H) 0.0 - 4.4 ratio    Comment:  T. Chol/HDL Ratio                                             Men  Women                               1/2 Avg.Risk  3.4    3.3                                   Avg.Risk  5.0    4.4                                2X Avg.Risk  9.6    7.1                                3X Avg.Risk 23.4   11.0   Hemoglobin A1c     Status: Abnormal   Collection Time: 01/09/19  9:43 AM  Result Value Ref Range   Hgb A1c MFr Bld 11.6 (H) 4.8 - 5.6 %    Comment:          Prediabetes: 5.7 - 6.4          Diabetes: >6.4          Glycemic control for adults with diabetes: <7.0    Est. average glucose Bld gHb Est-mCnc 286 mg/dL  T4, free     Status: None   Collection Time: 01/09/19  9:43 AM  Result Value Ref Range   Free T4 1.05 0.82 - 1.77 ng/dL  TSH     Status: None   Collection Time: 01/09/19  9:43 AM  Result Value Ref Range   TSH 1.460 0.450 - 4.500 uIU/mL  VITAMIN D 25 Hydroxy (Vit-D Deficiency, Fractures)     Status: Abnormal   Collection Time: 01/09/19  9:43 AM  Result Value Ref Range   Vit D, 25-Hydroxy 25.0 (L) 30.0 - 100.0 ng/mL    Comment: Vitamin D deficiency has been defined by the [place] and an Endocrine Society practice guideline as a level of serum 25-OH vitamin D less than 20 ng/mL (1,2). The Endocrine Society went on to further define vitamin D insufficiency as a level between 21 and 29 ng/mL (2). 1. IOM  (Institute of Medicine). 2010. Dietary reference    intakes for calcium and D. [place]: The    [employer]. 2. Holick MF, Binkley [place], Bischoff-Ferrari HA, et al.    Evaluation, treatment, and prevention of vitamin D    deficiency: an Endocrine Society clinical practice    guideline. JCEM. 2011 Jul; 96(7):1911-30.   POCT UA - Microalbumin     Status: Abnormal   Collection Time: 01/11/19 10:31 AM  Result Value Ref Range   Microalbumin Ur, POC 10 mg/L   Creatinine, POC 50 mg/dL   Albumin/Creatinine Ratio, Urine, POC 30-300

## 2019-01-11 NOTE — Patient Instructions (Addendum)
Please bring in a log of all your blood pressures and blood sugars from home.  Please see below on when and how to check your blood sugars.           Diabetes Mellitus and Standards of Medical Care  Managing diabetes (diabetes mellitus) can be complicated. Your diabetes treatment may be managed by a team of health care providers, including:  A diet and nutrition specialist (registered dietitian).  A nurse.  A certified diabetes educator (CDE).  A diabetes specialist (endocrinologist).  An eye doctor.  A primary care provider.  A dentist.  Your health care providers follow a schedule in order to help you get the best quality of care. The following schedule is a general guideline for your diabetes management plan. Your health care providers may also give you more specific instructions.  HbA1c (hemoglobin A1c) test This test provides information about blood sugar (glucose) control over the previous 2-3 months. It is used to check whether your diabetes management plan needs to be adjusted.  If you are meeting your treatment goals, this test is done at least 2 times a year.  If you are not meeting treatment goals or if your treatment goals have changed, this test is done 4 times a year.  Blood pressure test  This test is done at every routine medical visit. For most people, the goal is less than 130/80. Ask your health care provider what your goal blood pressure should be.  Dental and eye exams  Visit your dentist two times a year.  If you have type 1 diabetes, get an eye exam 3-5 years after you are diagnosed, and then once a year after your first exam. ? If you were diagnosed with type 1 diabetes as a child, get an eye exam when you are age 47 or older and have had diabetes for 3-5 years. After the first exam, you should get an eye exam once a year.  If you have type 2 diabetes, have an eye exam as soon as you are diagnosed, and then once a year after your first  exam.  Foot care exam  Visual foot exams are done at every routine medical visit. The exams check for cuts, bruises, redness, blisters, sores, or other problems with the feet.  A complete foot exam is done by your health care provider once a year. This exam includes an inspection of the structure and skin of your feet, and a check of the pulses and sensation in your feet. ? Type 1 diabetes: Get your first exam 3-5 years after diagnosis. ? Type 2 diabetes: Get your first exam as soon as you are diagnosed.  Check your feet every day for cuts, bruises, redness, blisters, or sores. If you have any of these or other problems that are not healing, contact your health care provider.  Kidney function test (urine microalbumin)  This test is done once a year. ? Type 1 diabetes: Get your first test 5 years after diagnosis. ? Type 2 diabetes: Get your first test as soon as you are diagnosed._  If you have chronic kidney disease (CKD), get a serum creatinine and estimated glomerular filtration rate (eGFR) test once a year.  Lipid profile (cholesterol, HDL, LDL, triglycerides)  This test should be done when you are diagnosed with diabetes, and every 5 years after the first test. If you are on medicines to lower your cholesterol, you may need to get this test done every year. ? The goal for LDL  is less than 100 mg/dL (5.5 mmol/L). If you are at high risk, the goal is less than 70 mg/dL (3.9 mmol/L). ? The goal for HDL is 40 mg/dL (2.2 mmol/L) for men and 50 mg/dL(2.8 mmol/L) for women. An HDL cholesterol of 60 mg/dL (3.3 mmol/L) or higher gives some protection against heart disease. ? The goal for triglycerides is less than 150 mg/dL (8.3 mmol/L).  Immunizations  The yearly flu (influenza) vaccine is recommended for everyone 6 months or older who has diabetes.  The pneumonia (pneumococcal) vaccine is recommended for everyone 2 years or older who has diabetes. If you are 28 or older, you may get the  pneumonia vaccine as a series of two separate shots.  The hepatitis B vaccine is recommended for adults shortly after they have been diagnosed with diabetes.  The Tdap (tetanus, diphtheria, and pertussis) vaccine should be given: ? According to normal childhood vaccination schedules, for children. ? Every 10 years, for adults who have diabetes.  The shingles vaccine is recommended for people who have had chicken pox and are 50 years or older.  Mental and emotional health  Screening for symptoms of eating disorders, anxiety, and depression is recommended at the time of diagnosis and afterward as needed. If your screening shows that you have symptoms (you have a positive screening result), you may need further evaluation and be referred to a mental health care provider.  Diabetes self-management education  Education about how to manage your diabetes is recommended at diagnosis and ongoing as needed.  Treatment plan  Your treatment plan will be reviewed at every medical visit.  Summary  Managing diabetes (diabetes mellitus) can be complicated. Your diabetes treatment may be managed by a team of health care providers.  Your health care providers follow a schedule in order to help you get the best quality of care.  Standards of care including having regular physical exams, blood tests, blood pressure monitoring, immunizations, screening tests, and education about how to manage your diabetes.  Your health care providers may also give you more specific instructions based on your individual health.      Type 2 Diabetes Mellitus, Self Care, Adult Caring for yourself after you have been diagnosed with type 2 diabetes (type 2 diabetes mellitus) means keeping your blood sugar (glucose) under control with a balance of:  Nutrition.  Exercise.  Lifestyle changes.  Medicines or insulin, if necessary.  Support from your team of health care providers and others.  The following  information explains what you need to know to manage your diabetes at home. What do I need to do to manage my blood glucose?  Check your blood glucose every day, as often as told by your health care provider.  Contact your health care provider if your blood glucose is above your target for 2 tests in a row.  Have your A1c (hemoglobin A1c) level checked at least two times a year, or as often as told by your health care provider. Your health care provider will set individualized treatment goals for you. Generally, the goal of treatment is to maintain the following blood glucose levels:  Before meals (preprandial): 80-130 mg/dL (4.4-7.2 mmol/L).  After meals (postprandial): below 180 mg/dL (10 mmol/L).  A1c level: less than 7%.  What do I need to know about hyperglycemia and hypoglycemia? What is hyperglycemia? Hyperglycemia, also called high blood glucose, occurs when blood glucose is too high.Make sure you know the early signs of hyperglycemia, such as:  Increased thirst.  Hunger.  Feeling very tired.  Needing to urinate more often than usual.  Blurry vision.  What is hypoglycemia? Hypoglycemia, also called low blood glucose, occurswith a blood glucose level at or below 70 mg/dL (3.9 mmol/L). The risk for hypoglycemia increases during or after exercise, during sleep, during illness, and when skipping meals or not eating for a long time (fasting). It is important to know the symptoms of hypoglycemia and treat it right away. Always have a 15-gram rapid-acting carbohydrate snack with you to treat low blood glucose. Family members and close friends should also know the symptoms and should understand how to treat hypoglycemia, in case you are not able to treat yourself. What are the symptoms of hypoglycemia? Hypoglycemia symptoms can include:  Hunger.  Anxiety.  Sweating and feeling clammy.  Confusion.  Dizziness or feeling light-headed.  Sleepiness.  Nausea.  Increased  heart rate.  Headache.  Blurry vision.  Seizure.  Nightmares.  Tingling or numbness around the mouth, lips, or tongue.  A change in speech.  Decreased ability to concentrate.  A change in coordination.  Restless sleep.  Tremors or shakes.  Fainting.  Irritability.  How do I treat hypoglycemia?  If you are alert and able to swallow safely, follow the 15:15 rule:  Take 15 grams of a rapid-acting carbohydrate. Rapid-acting options include: ? 1 tube of glucose gel. ? 3 glucose pills. ? 6-8 pieces of hard candy. ? 4 oz (120 mL) of fruit juice. ? 4 oz (120 mL) of regular (not diet) soda.  Check your blood glucose 15 minutes after you take the carbohydrate.  If the repeat blood glucose level is still at or below 70 mg/dL (3.9 mmol/L), take 15 grams of a carbohydrate again.  If your blood glucose level does not increase above 70 mg/dL (3.9 mmol/L) after 3 tries, seek emergency medical care.  After your blood glucose level returns to normal, eat a meal or a snack within 1 hour.  How do I treat severe hypoglycemia? Severe hypoglycemia is when your blood glucose level is at or below 54 mg/dL (3 mmol/L). Severe hypoglycemia is an emergency. Do not wait to see if the symptoms will go away. Get medical help right away. Call your local emergency services (911 in the [place] Do not drive yourself to the hospital. If you have severe hypoglycemia and you cannot eat or drink, you may need an injection of glucagon. A family member or close friend should learn how to check your blood glucose and how to give you a glucagon injection. Ask your health care provider if you need to have an emergency glucagon injection kit available. Severe hypoglycemia may need to be treated in a hospital. The treatment may include getting glucose through an IV tube. You may also need treatment for the cause of your hypoglycemia. Can having diabetes put me at risk for other conditions? Having diabetes can put  you at risk for other long-term (chronic) conditions, such as heart disease and kidney disease. Your health care provider may prescribe medicines to help prevent complications from diabetes. These medicines may include:  Aspirin.  Medicine to lower cholesterol.  Medicine to control blood pressure.  What else can I do to manage my diabetes? Take your diabetes medicines as told  If your health care provider prescribed insulin or diabetes medicines, take them every day.  Do not run out of insulin or other diabetes medicines that you take. Plan ahead so you always have these available.  If you use  insulin, adjust your dosage based on how physically active you are and what foods you eat. Your health care provider will tell you how to adjust your dosage. Make healthy food choices  The things that you eat and drink affect your blood glucose and your insulin dosage. Making good choices helps to control your diabetes and prevent other health problems. A healthy meal plan includes eating lean proteins, complex carbohydrates, fresh fruits and vegetables, low-fat dairy products, and healthy fats. Make an appointment to see a diet and nutrition specialist (registered dietitian) to help you create an eating plan that is right for you. Make sure that you:  Follow instructions from your health care provider about eating or drinking restrictions.  Drink enough fluid to keep your urine clear or pale yellow.  Eat healthy snacks between nutritious meals.  Track the carbohydrates that you eat. Do this by reading food labels and learning the standard serving sizes of foods.  Follow your sick day plan whenever you cannot eat or drink as usual. Make this plan in advance with your health care provider.  Stay active  Exercise regularly, as told by your health care provider. This may include:  Stretching and doing strength exercises, such as yoga or weightlifting, at least 2 times a week.  Doing at least  150 minutes of moderate-intensity or vigorous-intensity exercise each week. This could be brisk walking, biking, or water aerobics. ? Spread out your activity over at least 3 days of the week. ? Do not go more than 2 days in a row without doing some kind of physical activity.  When you start a new exercise or activity, work with your health care provider to adjust your insulin, medicines, or food intake as needed. Make healthy lifestyle choices  Do not use any tobacco products, such as cigarettes, chewing tobacco, and e-cigarettes. If you need help quitting, ask your health care provider.  If your health care provider says that alcohol is safe for you, limit alcohol intake to no more than 1 drink per day for nonpregnant women and 2 drinks per day for men. One drink equals 12 oz of beer, 5 oz of wine, or 1 oz of hard liquor.  Learn to manage stress. If you need help with this, ask your health care provider. Care for your body   Keep your immunizations up to date. In addition to getting vaccinations as told by your health care provider, it is recommended that you get vaccinated against the following illnesses: ? The flu (influenza). Get a flu shot every year. ? Pneumonia. ? Hepatitis B.  Schedule an eye exam soon after your diagnosis, and then one time every year after that.  Check your skin and feet every day for cuts, bruises, redness, blisters, or sores. Schedule a foot exam with your health care provider once every year.  Brush your teeth and gums two times a day, and floss at least one time a day. Visit your dentist at least once every 6 months.  Maintain a healthy weight. General instructions  Take over-the-counter and prescription medicines only as told by your health care provider.  Share your diabetes management plan with people in your workplace, school, and household.  Check your urine for ketones when you are ill and as told by your health care provider.  Ask your health  care provider: ? Do I need to meet with a diabetes educator? ? Where can I find a support group for people with diabetes?  Carry a medical  alert card or wear medical alert jewelry.  Keep all follow-up visits as told by your health care provider. This is important. Where to find more information: For more information about diabetes, visit:  American Diabetes Association (ADA): www.diabetes.org  American Association of Diabetes Educators (AADE): www.diabeteseducator.org/patient-resources  This information is not intended to replace advice given to you by your health care provider. Make sure you discuss any questions you have with your health care provider. Document Released: 02/23/2016 Document Revised: 04/08/2016 Document Reviewed: 12/05/2015 Elsevier Interactive Patient Education  2017 [employer].      Blood Glucose Monitoring, Adult Monitoring your blood sugar (glucose) helps you manage your diabetes. It also helps you and your health care provider determine how well your diabetes management plan is working. Blood glucose monitoring involves checking your blood glucose as often as directed, and keeping a record (log) of your results over time. Why should I monitor my blood glucose? Checking your blood glucose regularly can:  Help you understand how food, exercise, illnesses, and medicines affect your blood glucose.  Let you know what your blood glucose is at any time. You can quickly tell if you are having low blood glucose (hypoglycemia) or high blood glucose (hyperglycemia).  Help you and your health care provider adjust your medicines as needed.  When should I check my blood glucose? Follow instructions from your health care provider about how often to check your blood glucose.   This may depend on:  The type of diabetes you have.  How well-controlled your diabetes is.  Medicines you are taking.  If you have type 1 diabetes:  Check your blood glucose at least 2  times a day.  Also check your blood glucose: ? Before every insulin injection. ? Before and after exercise. ? Between meals. ? 2 hours after a meal. ? Occasionally between 2:00 a.m. and 3:00 a.m., as directed. ? Before potentially dangerous tasks, like driving or using heavy machinery. ? At bedtime.  You may need to check your blood glucose more often, up to 6-10 times a day: ? If you use an insulin pump. ? If you need multiple daily injections (MDI). ? If your diabetes is not well-controlled. ? If you are ill. ? If you have a history of severe hypoglycemia. ? If you have a history of not knowing when your blood glucose is getting low (hypoglycemia unawareness).  If you have type 2 diabetes:  If you take insulin or other diabetes medicines, check your blood glucose at least 2 times a day.  If you are on intensive insulin therapy, check your blood glucose at least 4 times a day. Occasionally, you may also need to check between 2:00 a.m. and 3:00 a.m., as directed.  Also check your blood glucose: ? Before and after exercise. ? Before potentially dangerous tasks, like driving or using heavy machinery.  You may need to check your blood glucose more often if: ? Your medicine is being adjusted. ? Your diabetes is not well-controlled. ? You are ill.  What is a blood glucose log?  A blood glucose log is a record of your blood glucose readings. It helps you and your health care provider: ? Look for patterns in your blood glucose over time. ? Adjust your diabetes management plan as needed.  Every time you check your blood glucose, write down your result and notes about things that may be affecting your blood glucose, such as your diet and exercise for the day.  Most glucose meters store  a record of glucose readings in the meter. Some meters allow you to download your records to a computer. How do I check my blood glucose? Follow these steps to get accurate readings of your blood  glucose: Supplies needed   Blood glucose meter.  Test strips for your meter. Each meter has its own strips. You must use the strips that come with your meter.  A needle to prick your finger (lancet). Do not use lancets more than once.  A device that holds the lancet (lancing device).  A journal or log book to write down your results.  Procedure  Wash your hands with soap and water.  Prick the side of your finger (not the tip) with the lancet. Use a different finger each time.  Gently rub the finger until a small drop of blood appears.  Follow instructions that come with your meter for inserting the test strip, applying blood to the strip, and using your blood glucose meter.  Write down your result and any notes.  Alternative testing sites  Some meters allow you to use areas of your body other than your finger (alternative sites) to test your blood.  If you think you may have hypoglycemia, or if you have hypoglycemia unawareness, do not use alternative sites. Use your finger instead.  Alternative sites may not be as accurate as the fingers, because blood flow is slower in these areas. This means that the result you get may be delayed, and it may be different from the result that you would get from your finger.  The most common alternative sites are: ? Forearm. ? Thigh. ? Palm of the hand.  Additional tips  Always keep your supplies with you.  If you have questions or need help, all blood glucose meters have a 24-hour hotline number that you can call. You may also contact your health care provider.  After you use a few boxes of test strips, adjust (calibrate) your blood glucose meter by following instructions that came with your meter.    The American Diabetes Association suggests the following targets for most nonpregnant adults with diabetes.  More or less stringent glycemic goals may be appropriate for each individual.  A1C: Less than 7% A1C may also be reported  as eAG: Less than 154 mg/dl Before a meal (preprandial plasma glucose): 80-130 mg/dl 1-2 hours after beginning of the meal (Postprandial plasma glucose)*: Less than 180 mg/dl  *Postprandial glucose may be targeted if A1C goals are not met despite reaching preprandial glucose goals.   GOALS in short:  The goals are for the Hgb A1C to be less than 7.0 & blood pressure to be less than 130/80.    It is recommended that all diabetics are educated on and follow a healthy diabetic diet, exercise for 30 minutes 3-4 times per week (walking, biking, swimming, or machine), monitor blood glucose readings and bring that record with you to be reviewed at your next office visit.     You should be checking fasting blood sugars- especially after you eat poorly or eat really healthy, and also check 2 hour postprandial blood sugars after largest meal of the day.    Write these down and bring in your log at each office visit.    You will need to be seen every 3 months by the provider managing your Diabetes unless told otherwise by that provider.   You will need yearly eye exams from an eye specialist and foot exams to check the nerves of  your feet.  Also, your urine should be checked yearly as well to make sure excess protein is not present.   If you are checking your blood pressure at home, please record it and bring it to your next office visit.    Follow the Dietary Approaches to Stop Hypertension (DASH) diet (3 servings of fruit and vegetables daily, whole grains, low sodium, low-fat proteins).  See below.    Lastly, when it comes to your cholesterol, the goal is to have the HDL (good cholesterol) >40, and the LDL (bad cholesterol) <100.   It is recommended that you follow a heart healthy, low saturated and trans-fat diet and exercise for 30 minutes at least 5 times a week.     (( Check out the DASH diet = 1.5 Gram Low Sodium Diet   A 1.5 gram sodium diet restricts the amount of sodium in the diet to no  more than 1.5 g or 1500 mg daily.  The American Heart Association recommends Americans over the age of 64 to consume no more than 1500 mg of sodium each day to reduce the risk of developing high blood pressure.  Research also shows that limiting sodium may reduce heart attack and stroke risk.  Many foods contain sodium for flavor and sometimes as a preservative.  When the amount of sodium in a diet needs to be low, it is important to know what to look for when choosing foods and drinks.  The following includes some information and guidelines to help make it easier for you to adapt to a low sodium diet.    QUICK TIPS  Do not add salt to food.  Avoid convenience items and fast food.  Choose unsalted snack foods.  Buy lower sodium products, often labeled as "lower sodium" or "no salt added."  Check food labels to learn how much sodium is in 1 serving.  When eating at a restaurant, ask that your food be prepared with less salt or none, if possible.    READING FOOD LABELS FOR SODIUM INFORMATION  The nutrition facts label is a good place to find how much sodium is in foods. Look for products with no more than 400 mg of sodium per serving.  Remember that 1.5 g = 1500 mg.  The food label may also list foods as:  Sodium-free: Less than 5 mg in a serving.  Very low sodium: 35 mg or less in a serving.  Low-sodium: 140 mg or less in a serving.  Light in sodium: 50% less sodium in a serving. For example, if a food that usually has 300 mg of sodium is changed to become light in sodium, it will have 150 mg of sodium.  Reduced sodium: 25% less sodium in a serving. For example, if a food that usually has 400 mg of sodium is changed to reduced sodium, it will have 300 mg of sodium.    CHOOSING FOODS  Grains  Avoid: Salted crackers and snack items. Some cereals, including instant hot cereals. Bread stuffing and biscuit mixes. Seasoned rice or pasta mixes.  Choose: Unsalted snack items. Low-sodium cereals,  oats, puffed wheat and rice, shredded wheat. English muffins and bread. Pasta.  Meats  Avoid: Salted, canned, smoked, spiced, pickled meats, including fish and poultry. Bacon, ham, sausage, cold cuts, hot dogs, anchovies.  Choose: Low-sodium canned tuna and salmon. Fresh or frozen meat, poultry, and fish.  Dairy  Avoid: Processed cheese and spreads. Cottage cheese. Buttermilk and condensed milk. Regular cheese.  Choose:  Milk. Low-sodium cottage cheese. Yogurt. Sour cream. Low-sodium cheese.  Fruits and Vegetables  Avoid: Regular canned vegetables. Regular canned tomato sauce and paste. Frozen vegetables in sauces. Olives. Angie Fava. Relishes. Sauerkraut.  Choose: Low-sodium canned vegetables. Low-sodium tomato sauce and paste. Frozen or fresh vegetables. Fresh and frozen fruit.  Condiments  Avoid: Canned and packaged gravies. Worcestershire sauce. Tartar sauce. Barbecue sauce. Soy sauce. Steak sauce. Ketchup. Onion, garlic, and table salt. Meat flavorings and tenderizers.  Choose: Fresh and dried herbs and spices. Low-sodium varieties of mustard and ketchup. Lemon juice. Tabasco sauce. Horseradish.    SAMPLE 1.5 GRAM SODIUM MEAL PLAN:   Breakfast / Sodium (mg)  1 cup low-fat milk / 143 mg  1 whole-wheat English muffin / 240 mg  1 tbs heart-healthy margarine / 153 mg  1 hard-boiled egg / 139 mg  1 small orange / 0 mg  Lunch / Sodium (mg)  1 cup raw carrots / 76 mg  2 tbs no salt added peanut butter / 5 mg  2 slices whole-wheat bread / 270 mg  1 tbs jelly / 6 mg   cup red grapes / 2 mg  Dinner / Sodium (mg)  1 cup whole-wheat pasta / 2 mg  1 cup low-sodium tomato sauce / 73 mg  3 oz lean ground beef / 57 mg  1 small side salad (1 cup raw spinach leaves,  cup cucumber,  cup yellow bell pepper) with 1 tsp olive oil and 1 tsp red wine vinegar / 25 mg  Snack / Sodium (mg)  1 container low-fat vanilla yogurt / 107 mg  3 graham cracker squares / 127 mg  Nutrient Analysis  Calories: 1745   Protein: 75 g  Carbohydrate: 237 g  Fat: 57 g  Sodium: 1425 mg  Document Released: 11/01/2005 Document Revised: 07/14/2011 Document Reviewed: 02/02/2010  ExitCare Patient Information 2012 [place].))    This information is not intended to replace advice given to you by your health care provider. Make sure you discuss any questions you have with your health care provider. Document Released: 11/04/2003 Document Revised: 05/21/2016 Document Reviewed: 04/12/2016 Elsevier Interactive Patient Education  2017 [place] for a Low Cholesterol, Low Saturated Fat Diet   Fats - Limit total intake of fats and oils. - Avoid butter, stick margarine, shortening, lard, palm and coconut oils. - Limit mayonnaise, salad dressings, gravies and sauces, unless they are homemade with low-fat ingredients. - Limit chocolate. - Choose low-fat and nonfat products, such as low-fat mayonnaise, low-fat or non-hydrogenated peanut butter, low-fat or fat-free salad dressings and nonfat gravy. - Use vegetable oil, such as canola or olive oil. - Look for margarine that does not contain trans fatty acids. - Use nuts in moderate amounts. - Read ingredient labels carefully to determine both amount and type of fat present in foods. Limit saturated and trans fats! - Avoid high-fat processed and convenience foods.  Meats and Meat Alternatives - Choose fish, chicken, [place] and lean meats. - Use dried beans, peas, lentils and tofu. - Limit egg yolks to three to four per week. - If you eat red meat, limit to no more than three servings per week and choose loin or round cuts. - Avoid fatty meats, such as bacon, sausage, franks, luncheon meats and ribs. - Avoid all organ meats, including liver.  Dairy - Choose nonfat or low-fat milk, yogurt and cottage cheese. - Most cheeses are high in fat. Choose cheeses made from non-fat milk, such  as mozzarella and ricotta cheese. - Choose light or  fat-free cream cheese and sour cream. - Avoid cream and sauces made with cream.  Fruits and Vegetables - Eat a wide variety of fruits and vegetables. - Use lemon juice, vinegar or "mist" olive oil on vegetables. - Avoid adding sauces, fat or oil to vegetables.  Breads, Cereals and Grains - Choose whole-grain breads, cereals, pastas and rice. - Avoid high-fat snack foods, such as granola, cookies, pies, pastries, doughnuts and croissants.  Cooking Tips - Avoid deep fried foods. - Trim visible fat off meats and remove skin from poultry before cooking. - Bake, broil, boil, poach or roast poultry, fish and lean meats. - Drain and discard fat that drains out of meat as you cook it. - Add little or no fat to foods. - Use vegetable oil sprays to grease pans for cooking or baking. - Steam vegetables. - Use herbs or no-oil marinades to flavor foods.  Nine ways to increase your "good" HDL cholesterol  High-density lipoprotein (HDL) is often referred to as the "good" cholesterol. Having high HDL levels helps carry cholesterol from your arteries to your liver, where it can be used or excreted.  Having high levels of HDL also has antioxidant and anti-inflammatory effects, and is linked to a reduced risk of heart disease (1, 2).  Most health experts recommend minimum blood levels of 40 mg/dl in men and 50 mg/dl in women.  While genetics definitely play a role, there are several other factors that affect HDL levels.  Here are nine healthy ways to raise your "good" HDL cholesterol.  1. Consume olive oil  two pieces of salmon on a plate olive oil being poured into a small dish Extra virgin olive oil may be more healthful than processed olive oils. Olive oil is one of the healthiest fats around.  A large analysis of 42 studies with more than 800,000 participants found that olive oil was the only source of monounsaturated fat that seemed to reduce heart disease risk (3).  Research has shown  that one of olive oil's heart-healthy effects is an increase in HDL cholesterol. This effect is thought to be caused by antioxidants it contains called polyphenols (4, 5, 6, 7).  Extra virgin olive oil has more polyphenols than more processed olive oils, although the amount can still vary among different types and brands.  One study gave 200 healthy young men about 2 tablespoons (25 ml) of different olive oils per day for three weeks.  The researchers found that participants' HDL levels increased significantly more after they consumed the olive oil with the highest polyphenol content (6).  In another study, when 39 older adults consumed about 4 tablespoons (50 ml) of high-polyphenol extra virgin olive oil every day for six weeks, their HDL cholesterol increased by 6.5 mg/dl, on average (7).  In addition to raising HDL levels, olive oil has been found to boost HDL's anti-inflammatory and antioxidant function in studies of older people and individuals with high cholesterol levels ( 7, 8, 9).  Whenever possible, select high-quality, certified extra virgin olive oils, which tend to be highest in polyphenols.  Bottom line: Extra virgin olive oil with a high polyphenol content has been shown to increase HDL levels in healthy people, the elderly and individuals with high cholesterol.  2. Follow a low-carb or ketogenic diet  Low-carb and ketogenic diets provide a number of health benefits, including weight loss and reduced blood sugar levels.  They have also been shown to  increase HDL cholesterol in people who tend to have lower levels.  This includes those who are obese, insulin resistant or diabetic (10, 11, 12, 13, 14, 15, 16, 17).  In one study, people with type 2 diabetes were split into two groups.  One followed a diet consuming less than 50 grams of carbs per day. The other followed a high-carb diet.  Although both groups lost weight, the low-carb group's HDL cholesterol increased almost  twice as much as the high-carb group's did (14).  In another study, obese people who followed a low-carb diet experienced an increase in HDL cholesterol of 5 mg/dl overall.  Meanwhile, in the same study, the participants who ate a low-fat, high-carb diet showed a decrease in HDL cholesterol (15).  This response may partially be due to the higher levels of fat people typically consume on low-carb diets.  One study in overweight women found that diets high in meat and cheese increased HDL levels by 5-8%, compared to a higher-carb diet (18).  What's more, in addition to raising HDL cholesterol, very-low-carb diets have been shown to decrease triglycerides and improve several other risk factors for heart disease (13, 14, 16, 17).  Bottom line: Low-carb and ketogenic diets typically increase HDL cholesterol levels in people with diabetes, metabolic syndrome and obesity.  3. Exercise regularly  Being physically active is important for heart health.  Studies have shown that many different types of exercise are effective at raising HDL cholesterol, including strength training, high-intensity exercise and aerobic exercise (19, 20, 21, 22, 23, 24).  However, the biggest increases in HDL are typically seen with high-intensity exercise.  One small study followed women who were living with polycystic ovary syndrome (PCOS), which is linked to a higher risk of insulin resistance. The study required them to perform high-intensity exercise three times a week.  The exercise led to an increase in HDL cholesterol of 8 mg/dL after 10 weeks. The women also showed improvements in other health markers, including decreased insulin resistance and improved arterial function (23).  In a 12-week study, overweight men who performed high-intensity exercise experienced a 10% increase in HDL cholesterol.  In contrast, the low-intensity exercise group showed only a 2% increase and the endurance training group experienced  no change (24).  However, even lower-intensity exercise seems to increase HDL's anti-inflammatory and antioxidant capacities, whether or not HDL levels change (20, 21, 25).  Overall, high-intensity exercise such as high-intensity interval training (HIIT) and high-intensity circuit training (HICT) may boost HDL cholesterol levels the most.  Bottom line: Exercising several times per week can help raise HDL cholesterol and enhance its anti-inflammatory and antioxidant effects. High-intensity forms of exercise may be especially effective.  4. Add coconut oil to your diet  Studies have shown that coconut oil may reduce appetite, increase metabolic rate and help protect brain health, among other benefits.  Some people may be concerned about coconut oil's effects on heart health due to its high saturated fat content.  However, it appears that coconut oil is actually quite heart healthy.  Coconut oil tends to raise HDL cholesterol more than many other types of fat.  In addition, it may improve the ratio of low-density-lipoprotein (LDL) cholesterol, the "bad" cholesterol, to HDL cholesterol. Improving this ratio reduces heart disease risk (26, 27, 28, 29).  One study examined the health effects of coconut oil on 108 women with excess belly fat. The researchers found that participants who took coconut oil daily experienced increased HDL cholesterol and a lower  LDL-to-HDL ratio.  In contrast, the group who took soybean oil daily had a decrease in HDL cholesterol and an increase in the LDL-to-HDL ratio (29).  Most studies have found these health benefits occur at a dosage of about 2 tablespoons (30 ml) of coconut oil per day. It's best to incorporate this into cooking rather than eating spoonfuls of coconut oil on their own.  Bottom line: Consuming 2 tablespoons (30 ml) of coconut oil per day may help increase HDL cholesterol levels.  5. Stop smoking  cigarette butt Quitting smoking can reduce the  risk of heart disease and lung cancer. Smoking increases the risk of many health problems, including heart disease and lung cancer (30).  One of its negative effects is a suppression of HDL cholesterol.  Some studies have found that quitting smoking can increase HDL levels. Indeed, one study found no significant differences in HDL levels between former smokers and people who had never smoked (31, 32, 33, 34, 35).  In a one-year study of more than 1,500 people, those who quit smoking had twice the increase in HDL as those who resumed smoking within the year. The number of large HDL particles also increased, which further reduced heart disease risk (32).  One study followed smokers who switched from traditional cigarettes to electronic cigarettes for one year. They found that the switch was associated with an increase in HDL cholesterol of 5 mg/dl, on average (33).  When it comes to the effect of nicotine replacement patches on HDL levels, research results have been mixed.  One study found that nicotine replacement therapy led to higher HDL cholesterol. However, other research suggests that people who use nicotine patches likely won't see increases in HDL levels until after replacement therapy is completed (34, 36).  Even in studies where HDL cholesterol levels didn't increase after people quit smoking, HDL function improved, resulting in less inflammation and other beneficial effects on heart health (37).  Bottom line: Quitting smoking can increase HDL levels, improve HDL function and help protect heart health.  6. Lose weight  When overweight and obese people lose weight, their HDL cholesterol levels usually increase.  What's more, this benefit seems to occur whether weight loss is achieved by calorie counting, carb restriction, intermittent fasting, weight loss surgery or a combination of diet and exercise (16, 38, 39, 40, 41, 42).  One study examined HDL levels in more than 3,000  overweight and obese [place] adults who followed a lifestyle modification program for one year.  The researchers found that losing at least 6.6 lbs (3 kg) led to an increase in HDL cholesterol of 4 mg/dl, on average (41).  In another study, when obese people with type 2 diabetes consumed calorie-restricted diets that provided 20-30% of calories from protein, they experienced significant increases in HDL cholesterol levels (42).  The key to achieving and maintaining healthy HDL cholesterol levels is choosing the type of diet that makes it easiest for you to lose weight and keep it off.  Bottom Line: Several methods of weight loss have been shown to increase HDL cholesterol levels in people who are overweight or obese.  7. Choose purple produce  Consuming purple-colored fruits and vegetables is a delicious way to potentially increase HDL cholesterol.  Purple produce contains antioxidants known as anthocyanins.  Studies using anthocyanin extracts have shown that they help fight inflammation, protect your cells from damaging free radicals and may also raise HDL cholesterol levels (43, 44, 45, 46).  In a 24-week study of  15 people with diabetes, those who took an anthocyanin supplement twice a day experienced a 19% increase in HDL cholesterol, on average, along with other improvements in heart health markers (45).  In another study, when people with cholesterol issues took anthocyanin extract for 12 weeks, their HDL cholesterol levels increased by 13.7% (46).  Although these studies used extracts instead of foods, there are several fruits and vegetables that are very high in anthocyanins. These include eggplant, purple corn, red cabbage, blueberries, blackberries and black raspberries.  Bottom line: Consuming fruits and vegetables rich in anthocyanins may help increase HDL cholesterol levels.  8. Eat fatty fish often  The omega-3 fats in fatty fish provide major benefits to heart health,  including a reduction in inflammation and better functioning of the cells that line your arteries (47, 48).  There's some research showing that eating fatty fish or taking fish oil may also help raise low levels of HDL cholesterol (49, 50, 51, 52, 53).  In a study of 33 heart disease patients, participants that consumed fatty fish four times per week experienced an increase in HDL cholesterol levels. The particle size of their HDL also increased (52).  In another study, overweight men who consumed herring five days a week for six weeks had a 5% increase in HDL cholesterol, compared with their levels after eating lean pork and chicken five days a week (53).  However, there are a few studies that found no increase in HDL cholesterol in response to increased fish or omega-3 supplement intake (54, 55).  In addition to herring, other types of fatty fish that may help raise HDL cholesterol include salmon, sardines, mackerel and anchovies.  Bottom line: Eating fatty fish several times per week may help increase HDL cholesterol levels and provide other benefits to heart health.  9. Avoid artificial trans fats  Artificial trans fats have many negative health effects due to their inflammatory properties (56, 57).  There are two types of trans fats. One kind occurs naturally in animal products, including full-fat dairy.  In contrast, the artificial trans fats found in margarines and processed foods are created by adding hydrogen to unsaturated vegetable and seed oils. These fats are also known as industrial trans fats or partially hydrogenated fats.  Research has shown that, in addition to increasing inflammation and contributing to several health problems, these artificial trans fats may lower HDL cholesterol levels.  In one study, researchers compared how people's HDL levels responded when they consumed different margarines.  The study found that participants' HDL cholesterol levels were 10% lower  after consuming margarine containing partially hydrogenated soybean oil, compared to their levels after consuming palm oil (58).  Another controlled study followed 40 adults who had diets high in different types of trans fats.  They found that HDL cholesterol levels in women were significantly lower after they consumed the diet high in industrial trans fats, compared to the diet containing naturally occurring trans fats (59).  To protect heart health and keep HDL cholesterol in the healthy range, it's best to avoid artificial trans fats altogether.  Bottom line: Artificial trans fats have been shown to lower HDL levels and increase inflammation, compared to other fats.  Take home message  Although your HDL cholesterol levels are partly determined by your genetics, there are many things you can do to naturally increase your own levels.  Fortunately, the practices that raise HDL cholesterol often provide other health benefits as well.   The quick and dirty--> lower triglyceride levels  more through...  1) - Beware of bad fats: Cutting back on saturated fat (in red meat and full-fat dairy foods) and trans fats (in restaurant fried foods and commercially prepared baked goods) can lower triglycerides.  2) - Go for good carbs: Easily digested carbohydrates (such as white bread, white rice, cornflakes, and sugary sodas) give triglycerides a definite boost.   3) - Eating whole grains and cutting back on soda can help control triglycerides.  4) - Check your alcohol use. In some people, alcohol dramatically boosts triglycerides. The only way to know if this is true for you is to avoid alcohol for a few weeks and have your triglycerides tested again.  5) - Go fish. Omega-3 fats in salmon, tuna, sardines, and other fatty fish can lower triglycerides. Having fish twice a week is fine.  6) - Aim for a healthy weight. If you are overweight, losing just 5% to 10% of your weight can help drive down  triglycerides.  7) - Get moving. Exercise lowers triglycerides and boosts heart-healthy HDL cholesterol.  8) - quit smoking if you do  --> for more information, see below; or go to  [email or web address]  and do a search for desired topics   For those diagnosed with high triglycerides, its important to take action to lower your levels and improve your heart health.  Triglyceride is just a fancy word for fat -- the fat in our bodies is stored in the form of triglycerides. Triglycerides are found in foods and manufactured in our bodies.  Normal triglyceride levels are defined as less than 150 mg/dL; 150 to 199 is considered borderline high; 200 to 499 is high; and 500 or higher is officially called very high. To me, anything over 150 is a red flag indicating my patient needs to take immediate steps to get the situation under control.   What is the significance of high triglycerides? High triglyceride levels make blood thicker and stickier, which means that it is more likely to form clots. Studies have shown that triglyceride levels are associated with increased risks of cardiovascular disease and stroke -- in both men and women -- alone or in combination with other risk factors (high triglycerides combined with high LDL cholesterol can be a particularly deadly combination). For example, in one ground-breaking study, high triglycerides alone increased the risk of cardiovascular disease by 14 percent in men, and by 43 percent in women. But when the test subjects also had low HDL cholesterol (thats the good cholesterol) and other risk factors, high triglycerides increased the risk of disease by 32 percent in men and 76 percent in women.   Fortunately, triglycerides can sometimes be controlled with several diet and lifestyle changes.    What Factors Can Increase Triglycerides? As with cholesterol, eating too much of the wrong kinds of fats will raise your blood triglycerides.  Therefore, its important to  restrict the amounts of saturated fats and trans fats you allow into your diet.  Triglyceride levels can also shoot up after eating foods that are high in carbohydrates or after drinking alcohol.  Thats why triglyceride blood tests require an overnight fast.  If you have elevated triglycerides, its especially important to avoid sugary and refined carbohydrates, including sugar, honey, and other sweeteners, soda and other sugary drinks, candy, baked goods, and anything made with white (refined or enriched) flour, including white bread, rolls, cereals, buns, pastries, regular pasta, and white rice.  Youll also want to limit dried fruit and fruit juice since theyre  dense in simple sugar.  All of these low-quality carbs cause a sudden rise in insulin, which may lead to a spike in triglycerides.  Triglycerides can also become elevated as a reaction to having diabetes, hypothyroidism, or kidney disease. As with most other heart-related factors, being overweight and inactive also contribute to abnormal triglycerides. And unfortunately, some people have a genetic predisposition that causes them to manufacture way too much triglycerides on their own, no matter how carefully they eat.     How Can You Lower Your Triglyceride Levels? If you are diagnosed with high triglycerides, its important to take action. There are several things you can do to help lower your triglyceride levels and improve your heart health:  --> Lose weight if you are overweight.  There is a clear correlation between obesity and high triglycerides -- the heavier people are, the higher their triglyceride levels are likely to be. The good news is that losing weight can significantly lower triglycerides. In a large study of individuals with type 2 diabetes, those assigned to the lifestyle intervention group -- which involved counseling, a low-calorie meal plan, and customized exercise program -- lost 8.6% of their body weight and lowered  their triglyceride levels by more than 16%. If youre overweight, find a weight loss plan that works for you and commit to shedding the pounds and getting healthier.  --> Reduce the amount of saturated fat and trans fat in your diet.  Start by avoiding or dramatically limiting butter, cream cheese, lard, sour cream, doughnuts, cakes, cookies, candy bars, regular ice cream, fried foods, pizza, cheese sauce, cream-based sauces and salad dressings, high-fat meats (including fatty hamburgers, bologna, pepperoni, sausage, bacon, salami, pastrami, spareribs, and hot dogs), high-fat cuts of beef and pork, and whole-milk dairy products.   Other ways to cut back: Choose lean meats only (including skinless chicken and [place], lean beef, lean pork), fish, and reduced-fat or fat-free dairy products.   Experiment with adding whole soy foods to your diet. Although soy itself may not reduce risk of heart disease, it replaces hazardous animal fats with healthier proteins. Choose high-quality soy foods, such as tofu, tempeh, soy milk, and edamame (whole soybeans).  Always remove skin from poultry.  Prepare foods by baking, roasting, broiling, boiling, poaching, steaming, grilling, or stir-frying in vegetable oil.  Most stick margarines contain trans fats, and trans fats are also found in some packaged baked goods, potato chips, snack foods, fried foods, and fast food that use or create hydrogenated oils.    (All food labels must now list the amount of trans fats, right after the amount of saturated fats -- good news for consumers. As a result, many food companies have now reformulated their products to be trans fat freemany, but not all! So its still just as important to read labels and make sure the packaged foods you buy dont contain trans fats.)     If you use margarine, purchase soft-tub margarine spreads that contain 0 grams trans fats and dont list any partially hydrogenated oils in the ingredients list. By  substituting olive oil or vegetable oil for trans fats in just 2 percent of your daily calories, you can reduce your risk of heart disease by 53 percent.   There is no safe amount of trans fats, so try to keep them as far from your plate as possible.  -->  Avoid foods that are concentrated in sugar (even dried fruit and fruit juice). Sugary foods can elevate triglyceride levels in the blood,  so keep them to a bare minimum.  --> Swap out refined carbohydrates for whole grains.  Refined carbohydrates -- like white rice, regular pasta, and anything made with white or enriched flour (including white bread, rolls, cereals, buns, and crackers) -- raise blood sugar and insulin levels more than fiber-rich whole grains. Higher insulin levels, in turn, can lead to a higher rise in triglycerides after a meal. So, make the switch to whole wheat bread, whole grain pasta, brown or wild rice, and whole grain versions of cereals, crackers, and other bread products. However, its important to know that individuals with high triglycerides should moderate even their intake of high-quality starches (since all starches raise blood sugar) -- I recommend 1 to 2 servings per meal.  --> Cut way back on alcohol.  If you have high triglycerides, alcohol should be considered a rare treat -- if you indulge at all, since even small amounts of alcohol can dramatically increase triglyceride levels.  --> Incorporate omega-3 fats.  Heart-healthy fish oils are especially rich in omega-3 fatty acids. In multiple studies over the past two decades, people who ate diets high in omega-3s had 30 to 40 percent reductions in heart disease. Although we dont yet know why fish oil works so well, there are several possibilities. Omega-3s seem to reduce inflammation, reduce high blood pressure, decrease triglycerides, raise HDL cholesterol, and make blood thinner and less sticky so it is less likely to clot. Its as close to a food prescription  for heart health as it gets. If you have high triglycerides, I recommend eating at least three servings of one of the omega-3-rich fish every week (fatty fish is the most concentrated food form of omega three fats). If you cannot manage to eat that much fish, speak with your physician about taking fish oil capsules, which offer similar benefits.The best foods for omega-3 fatty acids include wild salmon (fresh, canned), herring, mackerel (not king), sardines, anchovies, rainbow trout, and Pacific oysters. Non-fish sources of omega-3 fats include omega-3-fortified eggs, ground flaxseed, chia seeds, walnuts, butternuts (white walnuts), seaweed, walnut oil, canola oil, and soybeans.  --> Quit smoking.  Smoking causes inflammation, not just in your lungs, but throughout your body. Inflammation can contribute to atherosclerosis, blood clots, and risk of heart attack. Smoking makes all heart health indicators worse. If you have high cholesterol, high triglycerides, or high blood pressure, smoking magnifies the danger.  --> Become more physically active.  Even moderate exercise can help improve cholesterol, triglycerides, and blood pressure. Aerobic exercise seems to be able to stop the sharp rise of triglycerides after eating, perhaps because of a decrease in the amount of triglyceride released by the liver, or because active muscle clears triglycerides out of the blood stream more quickly than inactive muscle. If you havent exercised regularly (or at all) for years, I recommend starting slowly, by walking at an easy pace for 15 minutes a day. Then, as you feel more comfortable, increase the amount. Your ultimate goal should be at least 30 minutes of moderate physical activity, at least five days a week.

## 2019-01-12 ENCOUNTER — Encounter: Payer: Self-pay | Admitting: Family Medicine

## 2019-01-12 DIAGNOSIS — R809 Proteinuria, unspecified: Secondary | ICD-10-CM | POA: Insufficient documentation

## 2019-01-12 DIAGNOSIS — E1129 Type 2 diabetes mellitus with other diabetic kidney complication: Secondary | ICD-10-CM | POA: Insufficient documentation

## 2019-02-15 ENCOUNTER — Other Ambulatory Visit: Payer: Self-pay

## 2019-02-15 ENCOUNTER — Other Ambulatory Visit (INDEPENDENT_AMBULATORY_CARE_PROVIDER_SITE_OTHER): Payer: BLUE CROSS/BLUE SHIELD

## 2019-02-15 DIAGNOSIS — I10 Essential (primary) hypertension: Secondary | ICD-10-CM | POA: Diagnosis not present

## 2019-02-15 DIAGNOSIS — I152 Hypertension secondary to endocrine disorders: Secondary | ICD-10-CM

## 2019-02-15 DIAGNOSIS — E782 Mixed hyperlipidemia: Secondary | ICD-10-CM | POA: Diagnosis not present

## 2019-02-15 DIAGNOSIS — E1159 Type 2 diabetes mellitus with other circulatory complications: Secondary | ICD-10-CM

## 2019-02-15 DIAGNOSIS — E559 Vitamin D deficiency, unspecified: Secondary | ICD-10-CM | POA: Diagnosis not present

## 2019-02-15 DIAGNOSIS — R748 Abnormal levels of other serum enzymes: Secondary | ICD-10-CM

## 2019-02-15 DIAGNOSIS — E1169 Type 2 diabetes mellitus with other specified complication: Secondary | ICD-10-CM | POA: Diagnosis not present

## 2019-02-16 ENCOUNTER — Other Ambulatory Visit: Payer: Self-pay

## 2019-02-16 LAB — COMPREHENSIVE METABOLIC PANEL
ALT: 40 IU/L — ABNORMAL HIGH (ref 0–32)
AST: 20 IU/L (ref 0–40)
Albumin/Globulin Ratio: 1.7 (ref 1.2–2.2)
Albumin: 4.7 g/dL (ref 3.8–4.9)
Alkaline Phosphatase: 115 IU/L (ref 39–117)
BUN/Creatinine Ratio: 22 (ref 9–23)
BUN: 16 mg/dL (ref 6–24)
Bilirubin Total: 0.4 mg/dL (ref 0.0–1.2)
CO2: 26 mmol/L (ref 20–29)
Calcium: 10.7 mg/dL — ABNORMAL HIGH (ref 8.7–10.2)
Chloride: 93 mmol/L — ABNORMAL LOW (ref 96–106)
Creatinine, Ser: 0.73 mg/dL (ref 0.57–1.00)
GFR calc Af Amer: 110 mL/min/{1.73_m2} (ref >59)
GFR calc non Af Amer: 95 mL/min/{1.73_m2} (ref >59)
Globulin, Total: 2.7 g/dL (ref 1.5–4.5)
Glucose: 145 mg/dL — ABNORMAL HIGH (ref 65–99)
Potassium: 4 mmol/L (ref 3.5–5.2)
Sodium: 134 mmol/L (ref 134–144)
Total Protein: 7.4 g/dL (ref 6.0–8.5)

## 2019-02-16 LAB — HEMOGLOBIN A1C
Est. average glucose Bld gHb Est-mCnc: 226 mg/dL
Hgb A1c MFr Bld: 9.5 % — ABNORMAL HIGH (ref 4.8–5.6)

## 2019-02-16 LAB — VITAMIN D 25 HYDROXY (VIT D DEFICIENCY, FRACTURES): Vit D, 25-Hydroxy: 59.3 ng/mL (ref 30.0–100.0)

## 2019-02-20 ENCOUNTER — Encounter: Payer: Self-pay | Admitting: Family Medicine

## 2019-02-20 ENCOUNTER — Ambulatory Visit (INDEPENDENT_AMBULATORY_CARE_PROVIDER_SITE_OTHER): Payer: BLUE CROSS/BLUE SHIELD | Admitting: Family Medicine

## 2019-02-20 ENCOUNTER — Other Ambulatory Visit: Payer: Self-pay

## 2019-02-20 VITALS — Ht 62.0 in | Wt 188.0 lb

## 2019-02-20 DIAGNOSIS — E1159 Type 2 diabetes mellitus with other circulatory complications: Secondary | ICD-10-CM | POA: Diagnosis not present

## 2019-02-20 DIAGNOSIS — E1169 Type 2 diabetes mellitus with other specified complication: Secondary | ICD-10-CM | POA: Diagnosis not present

## 2019-02-20 DIAGNOSIS — F1010 Alcohol abuse, uncomplicated: Secondary | ICD-10-CM

## 2019-02-20 DIAGNOSIS — I10 Essential (primary) hypertension: Secondary | ICD-10-CM

## 2019-02-20 DIAGNOSIS — E559 Vitamin D deficiency, unspecified: Secondary | ICD-10-CM

## 2019-02-20 DIAGNOSIS — R7401 Elevation of levels of liver transaminase levels: Secondary | ICD-10-CM

## 2019-02-20 DIAGNOSIS — R740 Nonspecific elevation of levels of transaminase and lactic acid dehydrogenase [LDH]: Secondary | ICD-10-CM

## 2019-02-20 DIAGNOSIS — I152 Hypertension secondary to endocrine disorders: Secondary | ICD-10-CM

## 2019-02-20 DIAGNOSIS — E782 Mixed hyperlipidemia: Secondary | ICD-10-CM

## 2019-02-20 NOTE — Progress Notes (Signed)
Virtual Visit via Telephone Note for [employer] , D.O- [facility]   I connected with current patient today by telephone and verified that I am speaking with the correct person using two identifiers.   Because of federal recommendations of social distancing due to the current novel COVID-19 outbreak, an audio/video telehealth visit is felt to be most appropriate for this patient at this time.  My staff members also discussed with the patient that there may be a patient charge related to this service.   The patient expressed understanding, and agreed to proceed.    History of Present Illness:  Last office visit at the end of February we started patient on a lot of new medicines -cholesterol, blood pressure, diabetes, vitamin D deficiency meds etc.  Overall she is tolerating them all very well ( excpt victoza she hasn't taken due to fear of needles) and taking them as written per the patient.    DM - taking 1000 BID metformin, has cut out carbs and sodas--> walking a little now, 1 mile, 4 days a week now * 2 wks.      Was not able to tolerate victoza-->  can't give herself a needle.   FBS- not checking, 2 hr PP- 180-120's occ 221.   BP:  Tol new med well.  bp at home has been well controlled in the 120-110's / 70-80 on ave.  NO sx.    CHol:  tol one full tab of lipitor 80 mg well- no s-e    Obese:  Lost 10 pounds since last OV in 01/11/18.    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    Wt Readings from Last 3 Encounters:  02/20/19 188 lb (85.3 kg)  01/11/19 195 lb (88.5 kg)  12/26/18 196 lb 12.8 oz (89.3 kg)    BP Readings from Last 3 Encounters:  01/11/19 (!) 140/94  12/26/18 (!) 150/96  03/04/18 (!) 163/92    Pulse Readings from Last 3 Encounters:  01/11/19 78  12/26/18 92  03/04/18 98    BMI Readings from Last 3 Encounters:  02/20/19 34.39 kg/m  01/11/19 35.67 kg/m   12/26/18 36.00 kg/m    -Vitals obtained; Medications, allergies reconciled;  personal medical, social, Sx etc. etc. histories were updated by Leda MinMelissa Pulliam the medical assistant today and are reflected in below chart   Patient Care Team    Relationship Specialty Notifications Start End  Thomasene Lotpalski, , DO PCP - General Family Medicine  12/26/18      Patient Active Problem List   Diagnosis Date Noted  . h/o Elevated alanine aminotransferase (ALT) level 02/20/2019  . Persistent microalbuminuria associated with type 2 diabetes mellitus (HCC) 01/12/2019  . Elevated liver enzymes 01/11/2019  . Vitamin D deficiency 01/11/2019  . Smoker 01/11/2019  . Excessive drinking alcohol 01/11/2019  . Mixed diabetic hyperlipidemia associated with type 2 diabetes mellitus (HCC) 12/26/2018  . Hypertension associated with diabetes (HCC) 12/26/2018  . Type 2 diabetes mellitus with other specified complication (HCC) 12/26/2018  . BMI >er 35 with chronic conditions 12/26/2018  . Noncompliance with medication regimen-patient was without insurance and unable to afford medical care or medications many months until now 12/26/2018     Current Meds  Medication Sig  . atorvastatin (LIPITOR) 80 MG tablet 1/2 tab q hs for 6 days, then 1 po q hs (Patient taking differently: Take 80  mg by mouth daily at 6 PM. )  . metFORMIN (GLUCOPHAGE) 1000 MG tablet Take 1 tablet (1,000 mg total) by mouth 2 (two) times daily with a meal.  . Olmesartan-amLODIPine-HCTZ 20-5-12.5 MG TABS Take 0.5 tablets by mouth daily for 6 days, THEN 1 tablet daily. (Patient taking differently: 1 tab daily)  . Vitamin D, Ergocalciferol, (DRISDOL) 1.25 MG (50000 UT) CAPS capsule Take one tablet wkly     Allergies:  No Known Allergies   ROS:  See above HPI for pertinent positives and negatives   Objective:   Height 5\' 2"  (1.575 m), weight 188 lb (85.3 kg). General: sounds in no acute distress.  Skin: Pt confirms warm and dry   extremities and pink fingertips Respiratory: speaking in full sentences, no conversational dyspnea Psych: A and O *3, appears insight good, mood- full      Impression and Recommendations:    1. Type 2 diabetes mellitus with other specified complication, unspecified whether long term insulin use (HCC) Patient encouraged to try the Victoza.  I discussed the mechanism of the drug and why I feel it would be very helpful for her, all questions were answered -Discussed mechanisms of how to get over the fear of needles and ways to giving herself a shot and have it not hurt -Importance of checking fasting blood sugar and 2-hour postprandials discussed with patient.;  Highly encouraged to check when eats especially poorly and especially healthy. -Patient will need diabetic eye doctor and foot exam in the future but due to Covid-19, we will not refer her to an ophthalmologist today or in the near future -A1c was 11.6 on 01/09/2019 and now on 02/15/2019 it was 9.5, thus she had a two-point or more drop in a month's period of time with taking the metformin and dietary and lifestyle modification as well as 10 pound weight loss.  Strongly encouraged to continue. -We will recheck A1c 3 months from when we started the new medicines and lifestyle changes which would be around 04/12/2019.  2. Hypertension associated with diabetes (HCC) Blood pressure very well controlled after changing medicines last time.  Tolerating well. -Continue to monitor. -Explained to patient with weight loss and dietary and lifestyle modification, need for medicines will likely go down.  3. Mixed diabetic hyperlipidemia associated with type 2 diabetes mellitus (HCC) Tolerating the Lipitor 80 mg nightly quite well. -Dietary lifestyle changes discussed  4. h/o Elevated alanine aminotransferase (ALT) level Due to patient's decrease in alcohol intake and improve diet, despite starting high-dose Lipitor, her ALT recently did come down.  This  is great. -Educated patient on this and reasons why it has improved  5. Excessive drinking alcohol Strongly encouraged to continue to cut back.  Extensive discussion on improvement of ALT and alkaline phosphatase with decrease in alcohol intake and prudent diet and exercise  6. BMI <er 35 now with chronic conditions Congratulated patient on 10 pound weight loss.  When started her BMI was over 36 and now it is under 35. -Discussed the importance of weight loss in helping control her multiple medical conditions.  7. Vitamin D deficiency Vitamin D went from being too low to now in the normal range.  Continue supplement.    I discussed the assessment and treatment plan with the patient. The patient was provided an opportunity to ask questions and all were answered.  - The patient agreed with the plan and demonstrated an understanding of the instructions.   No barriers to understanding were identified.  Red  flag symptoms and signs discussed in detail.  Patient expressed understanding regarding what to do in case of emergency\urgent symptoms   The patient was advised to call back or seek an in-person evaluation if the symptoms worsen or if the condition fails to improve as anticipated.   Return for early June- OV for DM, BP, Chol, wt loss etc, will need A1c recked.    I provided 31:43 minutes of non-face-to-face time during this encounter.   Thomasene Lot, DO

## 2019-04-11 ENCOUNTER — Other Ambulatory Visit: Payer: Self-pay | Admitting: Family Medicine

## 2019-04-11 DIAGNOSIS — I152 Hypertension secondary to endocrine disorders: Secondary | ICD-10-CM

## 2019-04-11 DIAGNOSIS — E1159 Type 2 diabetes mellitus with other circulatory complications: Secondary | ICD-10-CM

## 2019-04-26 ENCOUNTER — Other Ambulatory Visit: Payer: Self-pay

## 2019-04-26 ENCOUNTER — Other Ambulatory Visit (INDEPENDENT_AMBULATORY_CARE_PROVIDER_SITE_OTHER): Payer: BC Managed Care – PPO

## 2019-04-26 DIAGNOSIS — E1169 Type 2 diabetes mellitus with other specified complication: Secondary | ICD-10-CM | POA: Diagnosis not present

## 2019-04-26 LAB — POCT GLYCOSYLATED HEMOGLOBIN (HGB A1C): Hemoglobin A1C: 6.7 % — AB (ref 4.0–5.6)

## 2019-04-26 NOTE — Progress Notes (Signed)
63

## 2019-05-10 ENCOUNTER — Other Ambulatory Visit: Payer: Self-pay

## 2019-05-10 ENCOUNTER — Ambulatory Visit (INDEPENDENT_AMBULATORY_CARE_PROVIDER_SITE_OTHER): Payer: BC Managed Care – PPO | Admitting: Family Medicine

## 2019-05-10 ENCOUNTER — Encounter: Payer: Self-pay | Admitting: Family Medicine

## 2019-05-10 VITALS — BP 120/82 | Ht 62.0 in | Wt 185.0 lb

## 2019-05-10 DIAGNOSIS — E782 Mixed hyperlipidemia: Secondary | ICD-10-CM | POA: Diagnosis not present

## 2019-05-10 DIAGNOSIS — I10 Essential (primary) hypertension: Secondary | ICD-10-CM

## 2019-05-10 DIAGNOSIS — E1159 Type 2 diabetes mellitus with other circulatory complications: Secondary | ICD-10-CM | POA: Diagnosis not present

## 2019-05-10 DIAGNOSIS — E1169 Type 2 diabetes mellitus with other specified complication: Secondary | ICD-10-CM | POA: Diagnosis not present

## 2019-05-10 DIAGNOSIS — R748 Abnormal levels of other serum enzymes: Secondary | ICD-10-CM

## 2019-05-10 DIAGNOSIS — I152 Hypertension secondary to endocrine disorders: Secondary | ICD-10-CM

## 2019-05-10 MED ORDER — ATORVASTATIN CALCIUM 80 MG PO TABS: 80.0000 mg | ORAL_TABLET | Freq: Every day | ORAL | 1 refills | Status: DC

## 2019-05-10 NOTE — Progress Notes (Signed)
Telehealth office visit note for Abigail Payne, D.O- at Primary Care at [facility]   I connected with current patient today and verified that I am speaking with the correct person using two identifiers.   . Location of the patient: Home . Location of the provider: Office Only the patient (+/- their family members at pt's discretion) and myself were participating in the encounter    - This visit type was conducted due to national recommendations for restrictions regarding the COVID-19 Pandemic (e.g. social distancing) in an effort to limit this patient's exposure and mitigate transmission in our community.  This format is felt to be most appropriate for this patient at this time.   - The patient did not have access to video technology or had technical difficulties with video requiring transitioning to audio format only. - No physical exam could be performed with this format, beyond that communicated to [place] by the patient/ family members as noted.   - Additionally my office staff/ schedulers discussed with the patient that there may be a monetary charge related to this service, depending on their medical insurance.   The patient expressed understanding, and agreed to proceed.       History of Present Illness:  At the end of February 2020 patient was started on a bunch of new medicines with new onset diabetes, hyperlipidemia, blood pressure meds and vitamin D deficiency meds.  She was last seen 02/20/2019 for follow-up  -Diabetes mellitus: Last time we encourage patient to start the Victoza.  Hasn't started to victoza- fear of needles too great.  Blood sugars have been running:  FBS 120-130;  2 hr PP: 150-160. - A1c was 11.6 on 01/09/2019 and now on 02/15/2019 it was 9.5, thus she had a two-point or more drop in a month's period of time with taking the metformin and dietary and lifestyle modification as well as 10 pound weight loss.   - working out each week, cutting back on carbs/ sugars.   Doesn't feel it's difficult to keep up with these dietary lifestyle modifications.  Feels great!!  She tells me she has no desire to eat poorly  Recent Results (from the past 2160 hour(s))  Comprehensive metabolic panel     Status: Abnormal   Collection Time: 02/15/19  8:43 AM  Result Value Ref Range   Glucose 145 (H) 65 - 99 mg/dL   BUN 16 6 - 24 mg/dL   Creatinine, Ser 0.73 0.57 - 1.00 mg/dL   GFR calc non Af Amer 95 >59 mL/min/1.73   GFR calc Af Amer 110 >59 mL/min/1.73   BUN/Creatinine Ratio 22 9 - 23   Sodium 134 134 - 144 mmol/L   Potassium 4.0 3.5 - 5.2 mmol/L   Chloride 93 (L) 96 - 106 mmol/L   CO2 26 20 - 29 mmol/L   Calcium 10.7 (H) 8.7 - 10.2 mg/dL   Total Protein 7.4 6.0 - 8.5 g/dL   Albumin 4.7 3.8 - 4.9 g/dL   Globulin, Total 2.7 1.5 - 4.5 g/dL   Albumin/Globulin Ratio 1.7 1.2 - 2.2   Bilirubin Total 0.4 0.0 - 1.2 mg/dL   Alkaline Phosphatase 115 39 - 117 IU/L   AST 20 0 - 40 IU/L   ALT 40 (H) 0 - 32 IU/L  VITAMIN D 25 Hydroxy (Vit-D Deficiency, Fractures)     Status: None   Collection Time: 02/15/19  8:43 AM  Result Value Ref Range   Vit D, 25-Hydroxy 59.3  30.0 - 100.0 ng/mL    Comment: Vitamin D deficiency has been defined by the [place] practice guideline as a level of serum 25-OH vitamin D less than 20 ng/mL (1,2). The Endocrine Society went on to further define vitamin D insufficiency as a level between 21 and 29 ng/mL (2). 1. IOM (Institute of Medicine). 2010. Dietary reference    intakes for calcium and D. [place]: The    [employer]. 2. Holick MF, Binkley [place], Bischoff-Ferrari HA, et al.    Evaluation, treatment, and prevention of vitamin D    deficiency: an Endocrine Society clinical practice    guideline. JCEM. 2011 Jul; 96(7):1911-30.   Hemoglobin A1c     Status: Abnormal   Collection Time: 02/15/19  8:43 AM  Result Value Ref Range   Hgb A1c MFr Bld 9.5 (H) 4.8 - 5.6 %    Comment:           Prediabetes: 5.7 - 6.4          Diabetes: >6.4          Glycemic control for adults with diabetes: <7.0    Est. average glucose Bld gHb Est-mCnc 226 mg/dL  POCT glycosylated hemoglobin (Hb A1C)     Status: Abnormal         Collection Time: 04/26/19  9:04 AM  Result Value Ref Range   Hemoglobin A1C 6.7 (A) 4.0 - 5.6 %   HbA1c POC (<> result, manual entry)     HbA1c, POC (prediabetic range)     HbA1c, POC (controlled diabetic range)      HTN:   120/82, 115/72, 112/75 -Tolerating medications well no side effects.  She denies any dizziness or lightheadedness if she gets up too quickly.  She feels great.  Hyperlipidemia: Patient has tolerated her Lipitor 80 mg nightly.  She was put on cholesterol meds with an LDL at 173, triglyceride 183 and HDL 59.  We have not rechecked it and will in the near future when she comes in for physical   Impression and Recommendations:    1. Type 2 diabetes mellitus with other specified complication, unspecified whether long term insulin use ([place])   2. Mixed diabetic hyperlipidemia associated with type 2 diabetes mellitus ([place])   3. Hypertension associated with diabetes ([place])   4. Elevated liver enzymes     -Diabetes: Patient continues to be doing wonderfully-her A1c when I first met her back in February was 11.6, now 6.7.  We will continue to monitor at home and keep blood sugar log.    -Blood pressure: At goal.  Patient remains on an arb in addition to amlodipine and hydrochlorothiazide.  Continue meds  -Hyperlipidemia: CMP after starting cholesterol meds were within normal limits.  We have not repeated cholesterol panel since on it.  Next time in 3 months we will recheck all of her labs.  - As part of my medical decision making, I reviewed the following data within the [place] History obtained from pt /family, CMA notes reviewed and incorporated if applicable, Labs reviewed, Radiograph/ tests reviewed if applicable and OV notes  from prior OV's with me, as well as other specialists she/he has seen since seeing me last, were all reviewed and used in my medical decision making process today.   - Additionally, discussion had with patient regarding txmnt plan, and their biases/concerns about that plan were used in my medical decision making today.   - The  patient agreed with the plan and demonstrated an understanding of the instructions.   No barriers to understanding were identified.   - Red flag symptoms and signs discussed in detail.  Patient expressed understanding regarding what to do in case of emergency\ urgent symptoms.  The patient was advised to call back or seek an in-person evaluation if the symptoms worsen or if the condition fails to improve as anticipated.   Return in about 3 months (around 08/10/2019) for Will need FBW.     Meds ordered this encounter  Medications  . atorvastatin (LIPITOR) 80 MG tablet    Sig: Take 1 tablet (80 mg total) by mouth at bedtime.    Dispense:  90 tablet    Refill:  1    Medications Discontinued During This Encounter  Medication Reason  . liraglutide (VICTOZA) 18 MG/3ML SOPN Patient has not taken in last 30 days  . atorvastatin (LIPITOR) 80 MG tablet   . liraglutide (VICTOZA) 18 MG/3ML SOPN Patient has not taken in last 30 days      I provided 15 minutes of non-face-to-face time during this encounter,with over 50% of the time in direct counseling on patients medical conditions/ medical concerns.  Additional time was spent with charting and coordination of care after the actual visit commenced.   Note:  This note was prepared with assistance of Dragon voice recognition software. Occasional wrong-word or sound-a-like substitutions may have occurred due to the inherent limitations of voice recognition software.  Abigail Dance, DO 05/10/2019 11:20 AM   Patient Care Team    Relationship Specialty Notifications Start End  Abigail Dance, DO PCP - General Family  Medicine  12/26/18      -Vitals obtained; medications/ allergies reconciled;  personal medical, social, Sx etc.histories were updated by CMA, reviewed by me and are reflected in chart   Patient Active Problem List   Diagnosis Date Noted  . h/o Elevated alanine aminotransferase (ALT) level 02/20/2019  . Persistent microalbuminuria associated with type 2 diabetes mellitus ([place]) 01/12/2019  . Elevated liver enzymes 01/11/2019  . Vitamin D deficiency 01/11/2019  . Smoker 01/11/2019  . Excessive drinking alcohol 01/11/2019  . Mixed diabetic hyperlipidemia associated with type 2 diabetes mellitus ([place]) 12/26/2018  . Hypertension associated with diabetes ([place]) 12/26/2018  . Type 2 diabetes mellitus with other specified complication ([place]) 79/89/2119  . BMI >er 35 with chronic conditions 12/26/2018  . Noncompliance with medication regimen-patient was without insurance and unable to afford medical care or medications many months until now 12/26/2018     Current Meds  Medication Sig  . atorvastatin (LIPITOR) 80 MG tablet Take 1 tablet (80 mg total) by mouth at bedtime.  . metFORMIN (GLUCOPHAGE) 1000 MG tablet Take 1 tablet (1,000 mg total) by mouth 2 (two) times daily with a meal.  . Olmesartan-amLODIPine-HCTZ 20-5-12.5 MG TABS Take 1 tablet by mouth daily.  . Vitamin D, Ergocalciferol, (DRISDOL) 1.25 MG (50000 UT) CAPS capsule Take one tablet wkly  . [DISCONTINUED] atorvastatin (LIPITOR) 80 MG tablet 1/2 tab q hs for 6 days, then 1 po q hs (Patient taking differently: Take 80 mg by mouth daily at 6 PM. )     Allergies:  No Known Allergies   ROS:  See above HPI for pertinent positives and negatives   Objective:   Blood pressure 120/82, height '5\' 2"'$  (1.575 m), weight 185 lb (83.9 kg).  (if some vitals are omitted, this means that patient was UNABLE to obtain them even though they were asked  to get them prior to OV today.  They were asked to call us at their earliest convenience with  these once obtained. )  General: A & O * 3; sounds in no acute distress; in usual state of health.  Skin: Pt confirms warm and dry extremities and pink fingertips HEENT: Pt confirms lips non-cyanotic Chest: Patient confirms normal chest excursion and movement Respiratory: speaking in full sentences, no conversational dyspnea; patient confirms no use of accessory muscles Psych: insight appears good, mood- appears full

## 2019-07-13 ENCOUNTER — Other Ambulatory Visit: Payer: Self-pay | Admitting: Family Medicine

## 2019-07-13 DIAGNOSIS — E1169 Type 2 diabetes mellitus with other specified complication: Secondary | ICD-10-CM

## 2019-07-13 DIAGNOSIS — E559 Vitamin D deficiency, unspecified: Secondary | ICD-10-CM

## 2019-07-13 DIAGNOSIS — E1159 Type 2 diabetes mellitus with other circulatory complications: Secondary | ICD-10-CM

## 2019-07-13 DIAGNOSIS — I152 Hypertension secondary to endocrine disorders: Secondary | ICD-10-CM

## 2019-07-13 DIAGNOSIS — E782 Mixed hyperlipidemia: Secondary | ICD-10-CM

## 2019-08-14 ENCOUNTER — Other Ambulatory Visit: Payer: Self-pay

## 2019-08-14 ENCOUNTER — Telehealth: Payer: Self-pay | Admitting: Family Medicine

## 2019-08-14 ENCOUNTER — Ambulatory Visit (INDEPENDENT_AMBULATORY_CARE_PROVIDER_SITE_OTHER): Payer: BC Managed Care – PPO | Admitting: Family Medicine

## 2019-08-14 ENCOUNTER — Encounter: Payer: Self-pay | Admitting: Gastroenterology

## 2019-08-14 ENCOUNTER — Other Ambulatory Visit (HOSPITAL_COMMUNITY)
Admission: RE | Admit: 2019-08-14 | Discharge: 2019-08-14 | Disposition: A | Discharge status: Home / Self Care | Payer: BC Managed Care – PPO | Source: Ambulatory Visit | Attending: Family Medicine | Admitting: Family Medicine

## 2019-08-14 ENCOUNTER — Encounter: Payer: Self-pay | Admitting: Family Medicine

## 2019-08-14 VITALS — BP 114/77 | HR 89 | Resp 16 | Ht 63.0 in | Wt 181.0 lb

## 2019-08-14 DIAGNOSIS — I10 Essential (primary) hypertension: Secondary | ICD-10-CM

## 2019-08-14 DIAGNOSIS — Z124 Encounter for screening for malignant neoplasm of cervix: Secondary | ICD-10-CM | POA: Diagnosis not present

## 2019-08-14 DIAGNOSIS — Z719 Counseling, unspecified: Secondary | ICD-10-CM

## 2019-08-14 DIAGNOSIS — E782 Mixed hyperlipidemia: Secondary | ICD-10-CM

## 2019-08-14 DIAGNOSIS — E1159 Type 2 diabetes mellitus with other circulatory complications: Secondary | ICD-10-CM

## 2019-08-14 DIAGNOSIS — Z23 Encounter for immunization: Secondary | ICD-10-CM

## 2019-08-14 DIAGNOSIS — I152 Hypertension secondary to endocrine disorders: Secondary | ICD-10-CM

## 2019-08-14 DIAGNOSIS — Z1211 Encounter for screening for malignant neoplasm of colon: Secondary | ICD-10-CM

## 2019-08-14 DIAGNOSIS — Z1239 Encounter for other screening for malignant neoplasm of breast: Secondary | ICD-10-CM

## 2019-08-14 DIAGNOSIS — Z0000 Encounter for general adult medical examination without abnormal findings: Secondary | ICD-10-CM

## 2019-08-14 DIAGNOSIS — R7401 Elevation of levels of liver transaminase levels: Secondary | ICD-10-CM

## 2019-08-14 DIAGNOSIS — F1010 Alcohol abuse, uncomplicated: Secondary | ICD-10-CM

## 2019-08-14 DIAGNOSIS — E1169 Type 2 diabetes mellitus with other specified complication: Secondary | ICD-10-CM

## 2019-08-14 DIAGNOSIS — R740 Nonspecific elevation of levels of transaminase and lactic acid dehydrogenase [LDH]: Secondary | ICD-10-CM

## 2019-08-14 DIAGNOSIS — E559 Vitamin D deficiency, unspecified: Secondary | ICD-10-CM

## 2019-08-14 DIAGNOSIS — F17200 Nicotine dependence, unspecified, uncomplicated: Secondary | ICD-10-CM

## 2019-08-14 LAB — POCT UA - MICROALBUMIN

## 2019-08-14 MED ORDER — ZOSTER VACCINE LIVE 19400 UNT/0.65ML ~~LOC~~ SUSR: 0.6500 mL | Freq: Once | SUBCUTANEOUS | 0 refills | Status: AC

## 2019-08-14 MED ORDER — SHINGRIX 50 MCG/0.5ML IM SUSR: 0.5000 mL | Freq: Once | INTRAMUSCULAR | 0 refills | Status: AC

## 2019-08-14 MED ORDER — TETANUS-DIPHTH-ACELL PERTUSSIS 5-2-15.5 LF-MCG/0.5 IM SUSP: 0.5000 mL | Freq: Once | INTRAMUSCULAR | 0 refills | Status: AC

## 2019-08-14 NOTE — Progress Notes (Addendum)
Impression and Recommendations:    1. Healthcare maintenance   2. Health education/counseling   3. Mixed diabetic hyperlipidemia associated with type 2 diabetes mellitus (HCC)   4. Hypertension associated with diabetes (HCC)   5. h/o Elevated alanine aminotransferase (ALT) level   6. Vitamin D deficiency   7. BMI >er 35 with chronic conditions   8. Smoker   9. Excessive drinking alcohol   10. Screening for cervical cancer   11. Need for viral immunization   12. Screening for breast cancer   13. Screening for colon cancer     - Diabetic foot sensory exam performed today by Myself- WNL's  1) Anticipatory Guidance: Discussed importance of wearing a seatbelt while driving, not texting while driving; sunscreen when outside along with yearly skin surveillance; eating a well balanced and modest diet; physical activity at least 25 minutes per day or 150 min/ week of moderate to intense activity.  - Discussed prudent self skin screening habits with patient during exam today.  Reviewed A,B,C,D's of skin screening.  - Prudent self-breast screening habits extensively reviewed with patient during exam today.  2) Immunizations / Screenings / Labs:  All immunizations and screenings that patient agrees to, are up-to-date per recommendations or will be updated today.  Patient understands the needs for q 48mo dental and yearly vision screens which pt will schedule independently. Obtain CBC, CMP, HgA1c, Lipid panel, TSH and vit D when fasting if not already done recently.   - Need for mammogram. - Discussed guidelines for preventative care mammogram screenings with patient today.  - Need for pap smear. - Discussed guidelines for preventative care pap smear screenings with patient today.  - Need for colonoscopy.  - Need for TDAP every ten years.  - Need for shingles vaccine.  - Discussed low risk Hep C and HIV screen.  Patient declines today.  - Need for flu vaccination.    - Extensive  education provided to patient and all questions answered.  - Referral placed today for diabetic eye exam.  3) Weight:   Discussed goal of losing even 5-10% of current body weight which would improve overall feelings of well being and improve objective health data significantly.   Improve nutrient density of diet through increasing intake of fruits and vegetables and decreasing saturated/trans fats, white flour products and refined sugar products.     Meds ordered this encounter  Medications   Zoster Vaccine Live, PF, (ZOSTAVAX) [id number] UNT/0.65ML injection    Sig: Inject 19,400 Units into the skin once for 1 dose.    Dispense:  1 each    Refill:  0   Tdap (ADACEL) 03-16-14.5 LF-MCG/0.5 injection    Sig: Inject 0.5 mLs into the muscle once for 1 dose.    Dispense:  0.5 mL    Refill:  0    Orders Placed This Encounter  Procedures   MM Digital Screening   CBC with Differential/Platelet   Hemoglobin A1c   TSH   T4, free   T3   Lipid panel   VITAMIN D 25 Hydroxy (Vit-D Deficiency, Fractures)   Comprehensive metabolic panel   Ambulatory referral to Ophthalmology   Ambulatory referral to Gastroenterology   POCT UA - Microalbumin    Gross side effects, risk and benefits, and alternatives of medications discussed with patient.  Patient is aware that all medications have potential side effects and we are unable to predict every side effect or drug-drug interaction that may occur.  Expresses verbal understanding  and consents to current therapy plan and treatment regimen.  F-up preventative CPE in 1 year. F/up sooner for chronic care management as discussed and/or prn.  Please see orders placed and AVS handed out to patient at the end of our visit for further patient instructions/ counseling done pertaining to today's office visit.  This document serves as a record of services personally performed by Mellody Dance, DO. It was created on her behalf by Toni Amend, a  trained medical scribe. The creation of this record is based on the scribe's personal observations and the provider's statements to them.   I have reviewed the above medical documentation for accuracy and completeness and I concur.  Mellody Dance, DO 08/15/2019 8:21 AM       Subjective:    Chief Complaint  Patient presents with   Annual Exam   CC:   HPI: Abigail Payne is a 53 y.o. female who presents to [place] at [facility] today a yearly health maintenance exam.  Health Maintenance Summary Reviewed and updated, unless pt declines services.  Colonoscopy:   Has never had a colonoscopy.  Denies family history of colon cancer. Tobacco History Reviewed:  Y; currently uses nicotine vape.  CT scan for screening lung CA:  Patient at age 44, too young for screening. Alcohol:  No concerns, no excessive use Exercise Habits:   Not meeting AHA guidelines STD concerns:  None reported. Drug Use:  None reported. Birth control method:  n/a Menses regular:  n/a Lumps or breast concerns:  no Breast Cancer Family History:  No Denies family history of breast cancer.  Patient denies concerns or sx today.  - Female Health States last had a mammogram about four years ago. Last pap smear was four years ago; has not had a new sexual partner since. Notes she did have an abnormal pap when she was really young, age 61. Says she can't remember getting it done but did have to stay home for two days. Pap smear has been normal since age 18. Formerly followed up with [facility].  - Dermatological Health Notes has been to dermatology in the past but not recently.  - Dental Health Obtains regular dental care every six months.     There is no immunization history on file for this patient.  Health Maintenance  Topic Date Due   FOOT EXAM  09/02/1976   OPHTHALMOLOGY EXAM  09/02/1976   PAP SMEAR-Modifier  08/22/2016   INFLUENZA VACCINE  06/16/2019    MAMMOGRAM  12/27/2019 (Originally 09/02/2016)   COLONOSCOPY  12/27/2019 (Originally 09/02/2016)   PNEUMOCOCCAL POLYSACCHARIDE VACCINE AGE 73-64 HIGH RISK  12/27/2019 (Originally 09/02/1968)   TETANUS/TDAP  12/27/2019 (Originally 09/02/1985)   HIV Screening  12/27/2019 (Originally 09/02/1981)   HEMOGLOBIN A1C  10/26/2019     Wt Readings from Last 3 Encounters:  08/14/19 181 lb (82.1 kg)  05/10/19 185 lb (83.9 kg)  02/20/19 188 lb (85.3 kg)   BP Readings from Last 3 Encounters:  08/14/19 114/77  05/10/19 120/82  01/11/19 (!) 140/94   Pulse Readings from Last 3 Encounters:  08/14/19 89  01/11/19 78  12/26/18 92     Past Medical History:  Diagnosis Date   Diabetes mellitus without complication ([place])    Hypertension       History reviewed. No pertinent surgical history.    Family History  Problem Relation Age of Onset   Diabetes Mother    Hyperlipidemia Mother    Hypertension Mother  Heart attack Father    Diabetes Father    Hyperlipidemia Father    Hypertension Father       Social History   Substance and Sexual Activity  Drug Use Never  ,   Social History   Substance and Sexual Activity  Alcohol Use Yes   Comment: 5 beers daily on an avg  ,   Social History   Tobacco Use  Smoking Status Never Smoker  Smokeless Tobacco Never Used  ,   Social History   Substance and Sexual Activity  Sexual Activity Yes   Birth control/protection: None    Current Outpatient Medications on File Prior to Visit  Medication Sig Dispense Refill   atorvastatin (LIPITOR) 80 MG tablet Take 1 tablet (80 mg total) by mouth at bedtime. 90 tablet 0   metFORMIN (GLUCOPHAGE) 1000 MG tablet TAKE 1 TABLET BY MOUTH 2 TIMES DAILY WITH A MEAL. 180 tablet 0   Olmesartan-amLODIPine-HCTZ 20-5-12.5 MG TABS TAKE 1 TABLET BY MOUTH DAILY. 90 tablet 0   Vitamin D, Ergocalciferol, (DRISDOL) 1.25 MG (50000 UT) CAPS capsule TAKE 1 CAPSULE BY MOUTH ONCE WEEKLY 12  capsule 1   No current facility-administered medications on file prior to visit.     Allergies: Patient has no known allergies.  Review of Systems: General:   Denies fever, chills, unexplained weight loss.  Optho/Auditory:   Denies visual changes, blurred vision/LOV Respiratory:   Denies SOB, DOE more than baseline levels.  Cardiovascular:   Denies chest pain, palpitations, new onset peripheral edema  Gastrointestinal:   Denies nausea, vomiting, diarrhea.  Genitourinary: Denies dysuria, freq/ urgency, flank pain or discharge from genitals.  Endocrine:     Denies hot or cold intolerance, polyuria, polydipsia. Musculoskeletal:   Denies unexplained myalgias, joint swelling, unexplained arthralgias, gait problems.  Skin:  Denies rash, suspicious lesions Neurological:     Denies dizziness, unexplained weakness, numbness  Psychiatric/Behavioral:   Denies mood changes, suicidal or homicidal ideations, hallucinations    Objective:    Blood pressure 114/77, pulse 89, resp. rate 16, height 5\' 3"  (1.6 m), weight 181 lb (82.1 kg), SpO2 96 %. Body mass index is 32.06 kg/m. General Appearance:    Alert, cooperative, no distress, appears stated age  Head:    Normocephalic, without obvious abnormality, atraumatic  Eyes:    PERRL, conjunctiva/corneas clear, EOM's intact, fundi    benign, both eyes  Ears:    Normal TM's and external ear canals, both ears  Nose:   Nares normal, septum midline, mucosa normal, no drainage    or sinus tenderness  Throat:   Lips w/o lesion, mucosa moist, and tongue normal; teeth and   gums normal  Neck:   Supple, symmetrical, trachea midline, no adenopathy;    thyroid:  no enlargement/tenderness/nodules; no carotid   bruit or JVD  Back:     Symmetric, no curvature, ROM normal, no CVA tenderness  Lungs:     Clear to auscultation bilaterally, respirations unlabored, no       Wh/ R/ R  Chest Wall:    No tenderness or gross deformity; normal excursion   Heart:     Regular rate and rhythm, S1 and S2 normal, no murmur, rub   or gallop  Breast Exam:    No tenderness, masses, or nipple abnormality b/l; no d/c  Abdomen:     Soft, non-tender, bowel sounds active all four quadrants, NO   G/R/R, no masses, no organomegaly  Genitalia:    Ext genitalia: without lesion, no  rash or discharge, No         tenderness;  Cervix: WNL's w/o discharge or lesion;        Adnexa:  No tenderness or palpable masses   Rectal:    Deferred because patient due for colonoscopy this year.  Extremities:   Extremities normal, atraumatic, no cyanosis or gross edema  Pulses:   2+ and symmetric all extremities  Skin:   Warm, dry, Skin color, texture, turgor normal, no obvious rashes or lesions Psych: No HI/SI, judgement and insight good, Euthymic mood. Full Affect.  Neurologic:   CNII-XII intact, normal strength, sensation and reflexes    Throughout

## 2019-08-14 NOTE — Telephone Encounter (Signed)
[facility] rep called states they need a Rx for a diff Shingles vaccine the [place] pulled Zostervax off the market on 05/16/2019.  Zoster Vaccine Live, PF, (ZOSTAVAX) 94327 UNT/0.65ML injection [[id number]]   Order Details Dose: 0.65 mL Route: Subcutaneous Frequency: Once  Dispense Quantity: 1 each Refills: 0 Fills remaining: --        Sig: Inject 19,400 Units into the skin once for 1 dose.             --Forwarding message to medical assistant for review w/ provider.  --glh

## 2019-08-14 NOTE — Telephone Encounter (Signed)
RX for Shingrix sent to [facility].  Charyl Bigger, CMA

## 2019-08-14 NOTE — Patient Instructions (Addendum)
Please give patient flu shot before she leaves clinic today.   Preventive Care for Adults, Female  A healthy lifestyle and preventive care can promote health and wellness. Preventive health guidelines for women include the following key practices.   A routine yearly physical is a good way to check with your health care provider about your health and preventive screening. It is a chance to share any concerns and updates on your health and to receive a thorough exam.   Visit your dentist for a routine exam and preventive care every 6 months. Brush your teeth twice a day and floss once a day. Good oral hygiene prevents tooth decay and gum disease.   The frequency of eye exams is based on your age, health, family medical history, use of contact lenses, and other factors. Follow your health care provider's recommendations for frequency of eye exams.   Eat a healthy diet. Foods like vegetables, fruits, whole grains, low-fat dairy products, and lean protein foods contain the nutrients you need without too many calories. Decrease your intake of foods high in solid fats, added sugars, and salt. Eat the right amount of calories for you.Get information about a proper diet from your health care provider, if necessary.   Regular physical exercise is one of the most important things you can do for your health. Most adults should get at least 150 minutes of moderate-intensity exercise (any activity that increases your heart rate and causes you to sweat) each week. In addition, most adults need muscle-strengthening exercises on 2 or more days a week.   Maintain a healthy weight. The body mass index (BMI) is a screening tool to identify possible weight problems. It provides an estimate of body fat based on height and weight. Your health care provider can find your BMI, and can help you achieve or maintain a healthy weight.For adults 20 years and older:   - A BMI below 18.5 is considered underweight.   - A  BMI of 18.5 to 24.9 is normal.   - A BMI of 25 to 29.9 is considered overweight.   - A BMI of 30 and above is considered obese.   Maintain normal blood lipids and cholesterol levels by exercising and minimizing your intake of trans and saturated fats.  Eat a balanced diet with plenty of fruit and vegetables. Blood tests for lipids and cholesterol should begin at age 41 and be repeated every 5 years minimum.  If your lipid or cholesterol levels are high, you are over 40, or you are at high risk for heart disease, you may need your cholesterol levels checked more frequently.Ongoing high lipid and cholesterol levels should be treated with medicines if diet and exercise are not working.   If you smoke, find out from your health care provider how to quit. If you do not use tobacco, do not start.   Lung cancer screening is recommended for adults aged 57-80 years who are at high risk for developing lung cancer because of a history of smoking. A yearly low-dose CT scan of the lungs is recommended for people who have at least a 30-pack-year history of smoking and are a current smoker or have quit within the past 15 years. A pack year of smoking is smoking an average of 1 pack of cigarettes a day for 1 year (for example: 1 pack a day for 30 years or 2 packs a day for 15 years). Yearly screening should continue until the smoker has stopped smoking for at least  15 years. Yearly screening should be stopped for people who develop a health problem that would prevent them from having lung cancer treatment.   If you are pregnant, do not drink alcohol. If you are breastfeeding, be very cautious about drinking alcohol. If you are not pregnant and choose to drink alcohol, do not have more than 1 drink per day. One drink is considered to be 12 ounces (355 mL) of beer, 5 ounces (148 mL) of wine, or 1.5 ounces (44 mL) of liquor.   Avoid use of street drugs. Do not share needles with anyone. Ask for help if you need  support or instructions about stopping the use of drugs.   High blood pressure causes heart disease and increases the risk of stroke. Your blood pressure should be checked at least yearly.  Ongoing high blood pressure should be treated with medicines if weight loss and exercise do not work.   If you are 47-52 years old, ask your health care provider if you should take aspirin to prevent strokes.   Diabetes screening involves taking a blood sample to check your fasting blood sugar level. This should be done once every 3 years, after age 44, if you are within normal weight and without risk factors for diabetes. Testing should be considered at a younger age or be carried out more frequently if you are overweight and have at least 1 risk factor for diabetes.   Breast cancer screening is essential preventive care for women. You should practice "breast self-awareness."  This means understanding the normal appearance and feel of your breasts and may include breast self-examination.  Any changes detected, no matter how small, should be reported to a health care provider.  Women in their 38s and 30s should have a clinical breast exam (CBE) by a health care provider as part of a regular health exam every 1 to 3 years.  After age 53, women should have a CBE every year.  Starting at age 54, women should consider having a mammogram (breast X-ray test) every year.  Women who have a family history of breast cancer should talk to their health care provider about genetic screening.  Women at a high risk of breast cancer should talk to their health care providers about having an MRI and a mammogram every year.   -Breast cancer gene (BRCA)-related cancer risk assessment is recommended for women who have family members with BRCA-related cancers. BRCA-related cancers include breast, ovarian, tubal, and peritoneal cancers. Having family members with these cancers may be associated with an increased risk for harmful changes  (mutations) in the breast cancer genes BRCA1 and BRCA2. Results of the assessment will determine the need for genetic counseling and BRCA1 and BRCA2 testing.   The Pap test is a screening test for cervical cancer. A Pap test can show cell changes on the cervix that might become cervical cancer if left untreated. A Pap test is a procedure in which cells are obtained and examined from the lower end of the uterus (cervix).   - Women should have a Pap test starting at age 101.   - Between ages 7 and 75, Pap tests should be repeated every 2 years.   - Beginning at age 32, you should have a Pap test every 3 years as long as the past 3 Pap tests have been normal.   - Some women have medical problems that increase the chance of getting cervical cancer. Talk to your health care provider about these problems. It  is especially important to talk to your health care provider if a new problem develops soon after your last Pap test. In these cases, your health care provider may recommend more frequent screening and Pap tests.   - The above recommendations are the same for women who have or have not gotten the vaccine for human papillomavirus (HPV).   - If you had a hysterectomy for a problem that was not cancer or a condition that could lead to cancer, then you no longer need Pap tests. Even if you no longer need a Pap test, a regular exam is a good idea to make sure no other problems are starting.   - If you are between ages 52 and 39 years, and you have had normal Pap tests going back 10 years, you no longer need Pap tests. Even if you no longer need a Pap test, a regular exam is a good idea to make sure no other problems are starting.   - If you have had past treatment for cervical cancer or a condition that could lead to cancer, you need Pap tests and screening for cancer for at least 20 years after your treatment.   - If Pap tests have been discontinued, risk factors (such as a new sexual partner) need to  be reassessed to determine if screening should be resumed.   - The HPV test is an additional test that may be used for cervical cancer screening. The HPV test looks for the virus that can cause the cell changes on the cervix. The cells collected during the Pap test can be tested for HPV. The HPV test could be used to screen women aged 79 years and older, and should be used in women of any age who have unclear Pap test results. After the age of 16, women should have HPV testing at the same frequency as a Pap test.   Colorectal cancer can be detected and often prevented. Most routine colorectal cancer screening begins at the age of 13 years and continues through age 58 years. However, your health care provider may recommend screening at an earlier age if you have risk factors for colon cancer. On a yearly basis, your health care provider may provide home test kits to check for hidden blood in the stool.  Use of a small camera at the end of a tube, to directly examine the colon (sigmoidoscopy or colonoscopy), can detect the earliest forms of colorectal cancer. Talk to your health care provider about this at age 60, when routine screening begins. Direct exam of the colon should be repeated every 5 -10 years through age 32 years, unless early forms of pre-cancerous polyps or small growths are found.   People who are at an increased risk for hepatitis B should be screened for this virus. You are considered at high risk for hepatitis B if:  -You were born in a country where hepatitis B occurs often. Talk with your health care provider about which countries are considered high risk.  - Your parents were born in a high-risk country and you have not received a shot to protect against hepatitis B (hepatitis B vaccine).  - You have HIV or AIDS.  - You use needles to inject street drugs.  - You live with, or have sex with, someone who has Hepatitis B.  - You get hemodialysis treatment.  - You take certain  medicines for conditions like cancer, organ transplantation, and autoimmune conditions.   Hepatitis C blood testing is  recommended for all people born from [birth year] through 1965 and any individual with known risks for hepatitis C.   Practice safe sex. Use condoms and avoid high-risk sexual practices to reduce the spread of sexually transmitted infections (STIs). STIs include gonorrhea, chlamydia, syphilis, trichomonas, herpes, HPV, and human immunodeficiency virus (HIV). Herpes, HIV, and HPV are viral illnesses that have no cure. They can result in disability, cancer, and death. Sexually active women aged 53 years and younger should be checked for chlamydia. Older women with new or multiple partners should also be tested for chlamydia. Testing for other STIs is recommended if you are sexually active and at increased risk.   Osteoporosis is a disease in which the bones lose minerals and strength with aging. This can result in serious bone fractures or breaks. The risk of osteoporosis can be identified using a bone density scan. Women ages 69 years and over and women at risk for fractures or osteoporosis should discuss screening with their health care providers. Ask your health care provider whether you should take a calcium supplement or vitamin D to There are also several preventive steps women can take to avoid osteoporosis and resulting fractures or to keep osteoporosis from worsening. -->Recommendations include:  Eat a balanced diet high in fruits, vegetables, calcium, and vitamins.  Get enough calcium. The recommended total intake of is 1,200 mg daily; for best absorption, if taking supplements, divide doses into 250-500 mg doses throughout the day. Of the two types of calcium, calcium carbonate is best absorbed when taken with food but calcium citrate can be taken on an empty stomach.  Get enough vitamin D. [employer] and the [place] recommend at least 1,000 IU per day for women  age 19 and over who are at risk of vitamin D deficiency. Vitamin D deficiency can be caused by inadequate sun exposure (for example, those who live in [place]).  Avoid alcohol and smoking. Heavy alcohol intake (more than 7 drinks per week) increases the risk of falls and hip fracture and women smokers tend to lose bone more rapidly and have lower bone mass than nonsmokers. Stopping smoking is one of the most important changes women can make to improve their health and decrease risk for disease.  Be physically active every day. Weight-bearing exercise (for example, fast walking, hiking, jogging, and weight training) may strengthen bones or slow the rate of bone loss that comes with aging. Balancing and muscle-strengthening exercises can reduce the risk of falling and fracture.  Consider therapeutic medications. Currently, several types of effective drugs are available. Healthcare providers can recommend the type most appropriate for each woman.  Eliminate environmental factors that may contribute to accidents. Falls cause nearly 90% of all osteoporotic fractures, so reducing this risk is an important bone-health strategy. Measures include ample lighting, removing obstructions to walking, using nonskid rugs on floors, and placing mats and/or grab bars in showers.  Be aware of medication side effects. Some common medicines make bones weaker. These include a type of steroid drug called glucocorticoids used for arthritis and asthma, some antiseizure drugs, certain sleeping pills, treatments for endometriosis, and some cancer drugs. An overactive thyroid gland or using too much thyroid hormone for an underactive thyroid can also be a problem. If you are taking these medicines, talk to your doctor about what you can do to help protect your bones.reduce the rate of osteoporosis.    Menopause can be associated with physical symptoms and risks. Hormone replacement therapy is available to decrease  symptoms and risks. You should talk to your health care provider about whether hormone replacement therapy is right for you.   Use sunscreen. Apply sunscreen liberally and repeatedly throughout the day. You should seek shade when your shadow is shorter than you. Protect yourself by wearing long sleeves, pants, a wide-brimmed hat, and sunglasses year round, whenever you are outdoors.   Once a month, do a whole body skin exam, using a mirror to look at the skin on your back. Tell your health care provider of new moles, moles that have irregular borders, moles that are larger than a pencil eraser, or moles that have changed in shape or color.   -Stay current with required vaccines (immunizations).   Influenza vaccine. All adults should be immunized every year.  Tetanus, diphtheria, and acellular pertussis (Td, Tdap) vaccine. Pregnant women should receive 1 dose of Tdap vaccine during each pregnancy. The dose should be obtained regardless of the length of time since the last dose. Immunization is preferred during the 27th 36th week of gestation. An adult who has not previously received Tdap or who does not know her vaccine status should receive 1 dose of Tdap. This initial dose should be followed by tetanus and diphtheria toxoids (Td) booster doses every 10 years. Adults with an unknown or incomplete history of completing a 3-dose immunization series with Td-containing vaccines should begin or complete a primary immunization series including a Tdap dose. Adults should receive a Td booster every 10 years.  Varicella vaccine. An adult without evidence of immunity to varicella should receive 2 doses or a second dose if she has previously received 1 dose. Pregnant females who do not have evidence of immunity should receive the first dose after pregnancy. This first dose should be obtained before leaving the health care facility. The second dose should be obtained 4 8 weeks after the first dose.  Human  papillomavirus (HPV) vaccine. Females aged 64 26 years who have not received the vaccine previously should obtain the 3-dose series. The vaccine is not recommended for use in pregnant females. However, pregnancy testing is not needed before receiving a dose. If a female is found to be pregnant after receiving a dose, no treatment is needed. In that case, the remaining doses should be delayed until after the pregnancy. Immunization is recommended for any person with an immunocompromised condition through the age of 67 years if she did not get any or all doses earlier. During the 3-dose series, the second dose should be obtained 4 8 weeks after the first dose. The third dose should be obtained 24 weeks after the first dose and 16 weeks after the second dose.  Zoster vaccine. One dose is recommended for adults aged 61 years or older unless certain conditions are present.  Measles, mumps, and rubella (MMR) vaccine. Adults born before 40 generally are considered immune to measles and mumps. Adults born in [birth year] or later should have 1 or more doses of MMR vaccine unless there is a contraindication to the vaccine or there is laboratory evidence of immunity to each of the three diseases. A routine second dose of MMR vaccine should be obtained at least 28 days after the first dose for students attending postsecondary schools, health care workers, or international travelers. People who received inactivated measles vaccine or an unknown type of measles vaccine during 1963 1967 should receive 2 doses of MMR vaccine. People who received inactivated mumps vaccine or an unknown type of mumps vaccine before 1979 and are at high  risk for mumps infection should consider immunization with 2 doses of MMR vaccine. For females of childbearing age, rubella immunity should be determined. If there is no evidence of immunity, females who are not pregnant should be vaccinated. If there is no evidence of immunity, females who are pregnant  should delay immunization until after pregnancy. Unvaccinated health care workers born before 29 who lack laboratory evidence of measles, mumps, or rubella immunity or laboratory confirmation of disease should consider measles and mumps immunization with 2 doses of MMR vaccine or rubella immunization with 1 dose of MMR vaccine.  Pneumococcal 13-valent conjugate (PCV13) vaccine. When indicated, a person who is uncertain of her immunization history and has no record of immunization should receive the PCV13 vaccine. An adult aged 8 years or older who has certain medical conditions and has not been previously immunized should receive 1 dose of PCV13 vaccine. This PCV13 should be followed with a dose of pneumococcal polysaccharide (PPSV23) vaccine. The PPSV23 vaccine dose should be obtained at least 8 weeks after the dose of PCV13 vaccine. An adult aged 12 years or older who has certain medical conditions and previously received 1 or more doses of PPSV23 vaccine should receive 1 dose of PCV13. The PCV13 vaccine dose should be obtained 1 or more years after the last PPSV23 vaccine dose.  Pneumococcal polysaccharide (PPSV23) vaccine. When PCV13 is also indicated, PCV13 should be obtained first. All adults aged 7 years and older should be immunized. An adult younger than age 23 years who has certain medical conditions should be immunized. Any person who resides in a nursing home or long-term care facility should be immunized. An adult smoker should be immunized. People with an immunocompromised condition and certain other conditions should receive both PCV13 and PPSV23 vaccines. People with human immunodeficiency virus (HIV) infection should be immunized as soon as possible after diagnosis. Immunization during chemotherapy or radiation therapy should be avoided. Routine use of PPSV23 vaccine is not recommended for American Indians, [place] Natives, or people younger than 65 years unless there are medical conditions  that require PPSV23 vaccine. When indicated, people who have unknown immunization and have no record of immunization should receive PPSV23 vaccine. One-time revaccination 5 years after the first dose of PPSV23 is recommended for people aged 14 64 years who have chronic kidney failure, nephrotic syndrome, asplenia, or immunocompromised conditions. People who received 1 2 doses of PPSV23 before age 77 years should receive another dose of PPSV23 vaccine at age 8 years or later if at least 5 years have passed since the previous dose. Doses of PPSV23 are not needed for people immunized with PPSV23 at or after age 64 years.  Meningococcal vaccine. Adults with asplenia or persistent complement component deficiencies should receive 2 doses of quadrivalent meningococcal conjugate (MenACWY-D) vaccine. The doses should be obtained at least 2 months apart. Microbiologists working with certain meningococcal bacteria, [place] recruits, people at risk during an outbreak, and people who travel to or live in countries with a high rate of meningitis should be immunized. A first-year college student up through age 29 years who is living in a residence hall should receive a dose if she did not receive a dose on or after her 16th birthday. Adults who have certain high-risk conditions should receive one or more doses of vaccine.  Hepatitis A vaccine. Adults who wish to be protected from this disease, have certain high-risk conditions, work with hepatitis A-infected animals, work in hepatitis A research labs, or travel to or work  in countries with a high rate of hepatitis A should be immunized. Adults who were previously unvaccinated and who anticipate close contact with an international adoptee during the first 60 days after arrival in the [place] from a country with a high rate of hepatitis A should be immunized.  Hepatitis B vaccine.  Adults who wish to be protected from this disease, have certain high-risk conditions,  may be exposed to blood or other infectious body fluids, are household contacts or sex partners of hepatitis B positive people, are clients or workers in certain care facilities, or travel to or work in countries with a high rate of hepatitis B should be immunized.  Haemophilus influenzae type b (Hib) vaccine. A previously unvaccinated person with asplenia or sickle cell disease or having a scheduled splenectomy should receive 1 dose of Hib vaccine. Regardless of previous immunization, a recipient of a hematopoietic stem cell transplant should receive a 3-dose series 6 12 months after her successful transplant. Hib vaccine is not recommended for adults with HIV infection.  Preventive Services / Frequency Ages 63 to 39years  Blood pressure check.** / Every 1 to 2 years.  Lipid and cholesterol check.** / Every 5 years beginning at age 29.  Clinical breast exam.** / Every 3 years for women in their 63s and 43s.  BRCA-related cancer risk assessment.** / For women who have family members with a BRCA-related cancer (breast, ovarian, tubal, or peritoneal cancers).  Pap test.** / Every 2 years from ages 59 through 3. Every 3 years starting at age 21 through age 56 or 42 with a history of 3 consecutive normal Pap tests.  HPV screening.** / Every 3 years from ages 88 through ages 33 to 55 with a history of 3 consecutive normal Pap tests.  Hepatitis C blood test.** / For any individual with known risks for hepatitis C.  Skin self-exam. / Monthly.  Influenza vaccine. / Every year.  Tetanus, diphtheria, and acellular pertussis (Tdap, Td) vaccine.** / Consult your health care provider. Pregnant women should receive 1 dose of Tdap vaccine during each pregnancy. 1 dose of Td every 10 years.  Varicella vaccine.** / Consult your health care provider. Pregnant females who do not have evidence of immunity should receive the first dose after pregnancy.  HPV vaccine. / 3 doses over 6 months, if 16 and  younger. The vaccine is not recommended for use in pregnant females. However, pregnancy testing is not needed before receiving a dose.  Measles, mumps, rubella (MMR) vaccine.** / You need at least 1 dose of MMR if you were born in 1957 or later. You may also need a 2nd dose. For females of childbearing age, rubella immunity should be determined. If there is no evidence of immunity, females who are not pregnant should be vaccinated. If there is no evidence of immunity, females who are pregnant should delay immunization until after pregnancy.  Pneumococcal 13-valent conjugate (PCV13) vaccine.** / Consult your health care provider.  Pneumococcal polysaccharide (PPSV23) vaccine.** / 1 to 2 doses if you smoke cigarettes or if you have certain conditions.  Meningococcal vaccine.** / 1 dose if you are age 14 to 80 years and a Market researcher living in a residence hall, or have one of several medical conditions, you need to get vaccinated against meningococcal disease. You may also need additional booster doses.  Hepatitis A vaccine.** / Consult your health care provider.  Hepatitis B vaccine.** / Consult your health care provider.  Haemophilus influenzae type b (Hib) vaccine.** /  Consult your health care provider.  Ages 52 to 64years  Blood pressure check.** / Every 1 to 2 years.  Lipid and cholesterol check.** / Every 5 years beginning at age 49 years.  Lung cancer screening. / Every year if you are aged 47 80 years and have a 30-pack-year history of smoking and currently smoke or have quit within the past 15 years. Yearly screening is stopped once you have quit smoking for at least 15 years or develop a health problem that would prevent you from having lung cancer treatment.  Clinical breast exam.** / Every year after age 14 years.  BRCA-related cancer risk assessment.** / For women who have family members with a BRCA-related cancer (breast, ovarian, tubal, or peritoneal  cancers).  Mammogram.** / Every year beginning at age 67 years and continuing for as long as you are in good health. Consult with your health care provider.  Pap test.** / Every 3 years starting at age 44 years through age 11 or 69 years with a history of 3 consecutive normal Pap tests.  HPV screening.** / Every 3 years from ages 31 years through ages 65 to 20 years with a history of 3 consecutive normal Pap tests.  Fecal occult blood test (FOBT) of stool. / Every year beginning at age 15 years and continuing until age 64 years. You may not need to do this test if you get a colonoscopy every 10 years.  Flexible sigmoidoscopy or colonoscopy.** / Every 5 years for a flexible sigmoidoscopy or every 10 years for a colonoscopy beginning at age 63 years and continuing until age 73 years.  Hepatitis C blood test.** / For all people born from [birth year] through 1965 and any individual with known risks for hepatitis C.  Skin self-exam. / Monthly.  Influenza vaccine. / Every year.  Tetanus, diphtheria, and acellular pertussis (Tdap/Td) vaccine.** / Consult your health care provider. Pregnant women should receive 1 dose of Tdap vaccine during each pregnancy. 1 dose of Td every 10 years.  Varicella vaccine.** / Consult your health care provider. Pregnant females who do not have evidence of immunity should receive the first dose after pregnancy.  Zoster vaccine.** / 1 dose for adults aged 38 years or older.  Measles, mumps, rubella (MMR) vaccine.** / You need at least 1 dose of MMR if you were born in 1957 or later. You may also need a 2nd dose. For females of childbearing age, rubella immunity should be determined. If there is no evidence of immunity, females who are not pregnant should be vaccinated. If there is no evidence of immunity, females who are pregnant should delay immunization until after pregnancy.  Pneumococcal 13-valent conjugate (PCV13) vaccine.** / Consult your health care  provider.  Pneumococcal polysaccharide (PPSV23) vaccine.** / 1 to 2 doses if you smoke cigarettes or if you have certain conditions.  Meningococcal vaccine.** / Consult your health care provider.  Hepatitis A vaccine.** / Consult your health care provider.  Hepatitis B vaccine.** / Consult your health care provider.  Haemophilus influenzae type b (Hib) vaccine.** / Consult your health care provider.  Ages 75 years and over  Blood pressure check.** / Every 1 to 2 years.  Lipid and cholesterol check.** / Every 5 years beginning at age 4 years.  Lung cancer screening. / Every year if you are aged 31 80 years and have a 30-pack-year history of smoking and currently smoke or have quit within the past 15 years. Yearly screening is stopped once you have quit smoking  for at least 15 years or develop a health problem that would prevent you from having lung cancer treatment.  Clinical breast exam.** / Every year after age 91 years.  BRCA-related cancer risk assessment.** / For women who have family members with a BRCA-related cancer (breast, ovarian, tubal, or peritoneal cancers).  Mammogram.** / Every year beginning at age 5 years and continuing for as long as you are in good health. Consult with your health care provider.  Pap test.** / Every 3 years starting at age 6 years through age 38 or 13 years with 3 consecutive normal Pap tests. Testing can be stopped between 65 and 70 years with 3 consecutive normal Pap tests and no abnormal Pap or HPV tests in the past 10 years.  HPV screening.** / Every 3 years from ages 56 years through ages 53 or 43 years with a history of 3 consecutive normal Pap tests. Testing can be stopped between 65 and 70 years with 3 consecutive normal Pap tests and no abnormal Pap or HPV tests in the past 10 years.  Fecal occult blood test (FOBT) of stool. / Every year beginning at age 86 years and continuing until age 25 years. You may not need to do this test if you  get a colonoscopy every 10 years.  Flexible sigmoidoscopy or colonoscopy.** / Every 5 years for a flexible sigmoidoscopy or every 10 years for a colonoscopy beginning at age 68 years and continuing until age 65 years.  Hepatitis C blood test.** / For all people born from [birth year] through 1965 and any individual with known risks for hepatitis C.  Osteoporosis screening.** / A one-time screening for women ages 50 years and over and women at risk for fractures or osteoporosis.  Skin self-exam. / Monthly.  Influenza vaccine. / Every year.  Tetanus, diphtheria, and acellular pertussis (Tdap/Td) vaccine.** / 1 dose of Td every 10 years.  Varicella vaccine.** / Consult your health care provider.  Zoster vaccine.** / 1 dose for adults aged 59 years or older.  Pneumococcal 13-valent conjugate (PCV13) vaccine.** / Consult your health care provider.  Pneumococcal polysaccharide (PPSV23) vaccine.** / 1 dose for all adults aged 55 years and older.  Meningococcal vaccine.** / Consult your health care provider.  Hepatitis A vaccine.** / Consult your health care provider.  Hepatitis B vaccine.** / Consult your health care provider.  Haemophilus influenzae type b (Hib) vaccine.** / Consult your health care provider. ** Family history and personal history of risk and conditions may change your health care provider's recommendations. Document Released: 12/28/2001 Document Revised: 08/22/2013  [facility] Patient Information 2014 [place], [place].   EXERCISE AND DIET:  We recommended that you start or continue a regular exercise program for good health. Regular exercise means any activity that makes your heart beat faster and makes you sweat.  We recommend exercising at least 30 minutes per day at least 3 days a week, preferably 5.  We also recommend a diet low in fat and sugar / carbohydrates.  Inactivity, poor dietary choices and obesity can cause diabetes, heart attack, stroke, and kidney damage, among  others.     ALCOHOL AND SMOKING:  Women should limit their alcohol intake to no more than 7 drinks/beers/glasses of wine (combined, not each!) per week. Moderation of alcohol intake to this level decreases your risk of breast cancer and liver damage.  ( And of course, no recreational drugs are part of a healthy lifestyle.)  Also, you should not be smoking at all or even  being exposed to second hand smoke. Most people know smoking can cause cancer, and various heart and lung diseases, but did you know it also contributes to weakening of your bones?  Aging of your skin?  Yellowing of your teeth and nails?   CALCIUM AND VITAMIN D:  Adequate intake of calcium and Vitamin D are recommended.  The recommendations for exact amounts of these supplements seem to change often, but generally speaking 600 mg of calcium (either carbonate or citrate) and 800 units of Vitamin D per day seems prudent. Certain women may benefit from higher intake of Vitamin D.  If you are among these women, your doctor will have told you during your visit.     PAP SMEARS:  Pap smears, to check for cervical cancer or precancers,  have traditionally been done yearly, although recent scientific advances have shown that most women can have pap smears less often.  However, every woman still should have a physical exam from her gynecologist or primary care physician every year. It will include a breast check, inspection of the vulva and vagina to check for abnormal growths or skin changes, a visual exam of the cervix, and then an exam to evaluate the size and shape of the uterus and ovaries.  And after 53 years of age, a rectal exam is indicated to check for rectal cancers. We will also provide age appropriate advice regarding health maintenance, like when you should have certain vaccines, screening for sexually transmitted diseases, bone density testing, colonoscopy, mammograms, etc.    MAMMOGRAMS:  All women over 41 years old should have  a yearly mammogram. Many facilities now offer a "3D" mammogram, which may cost around $50 extra out of pocket. If possible,  we recommend you accept the option to have the 3D mammogram performed.  It both reduces the number of women who will be called back for extra views which then turn out to be normal, and it is better than the routine mammogram at detecting truly abnormal areas.     COLONOSCOPY:  Colonoscopy to screen for colon cancer is recommended for all women at age 30.  We know, you hate the idea of the prep.  We agree, BUT, having colon cancer and not knowing it is worse!!  Colon cancer so often starts as a polyp that can be seen and removed at colonscopy, which can quite literally save your life!  And if your first colonoscopy is normal and you have no family history of colon cancer, most women don't have to have it again for 10 years.  Once every ten years, you can do something that may end up saving your life, right?  We will be happy to help you get it scheduled when you are ready.  Be sure to check your insurance coverage so you understand how much it will cost.  It may be covered as a preventative service at no cost, but you should check your particular policy.

## 2019-08-15 ENCOUNTER — Telehealth: Payer: Self-pay

## 2019-08-15 LAB — COMPREHENSIVE METABOLIC PANEL
ALT: 29 IU/L (ref 0–32)
AST: 22 IU/L (ref 0–40)
Albumin/Globulin Ratio: 2.1 (ref 1.2–2.2)
Albumin: 5 g/dL — ABNORMAL HIGH (ref 3.8–4.9)
Alkaline Phosphatase: 97 IU/L (ref 39–117)
BUN/Creatinine Ratio: 21 (ref 9–23)
BUN: 16 mg/dL (ref 6–24)
Bilirubin Total: 0.5 mg/dL (ref 0.0–1.2)
CO2: 26 mmol/L (ref 20–29)
Calcium: 9.7 mg/dL (ref 8.7–10.2)
Chloride: 98 mmol/L (ref 96–106)
Creatinine, Ser: 0.75 mg/dL (ref 0.57–1.00)
GFR calc Af Amer: 106 mL/min/{1.73_m2} (ref >59)
GFR calc non Af Amer: 92 mL/min/{1.73_m2} (ref >59)
Globulin, Total: 2.4 g/dL (ref 1.5–4.5)
Glucose: 135 mg/dL — ABNORMAL HIGH (ref 65–99)
Potassium: 4.4 mmol/L (ref 3.5–5.2)
Sodium: 138 mmol/L (ref 134–144)
Total Protein: 7.4 g/dL (ref 6.0–8.5)

## 2019-08-15 LAB — HEMOGLOBIN A1C
Est. average glucose Bld gHb Est-mCnc: 128 mg/dL
Hgb A1c MFr Bld: 6.1 % — ABNORMAL HIGH (ref 4.8–5.6)

## 2019-08-15 LAB — LIPID PANEL
Chol/HDL Ratio: 2.3 ratio (ref 0.0–4.4)
Cholesterol, Total: 169 mg/dL (ref 100–199)
HDL: 75 mg/dL (ref >39)
LDL Chol Calc (NIH): 73 mg/dL (ref 0–99)
Triglycerides: 119 mg/dL (ref 0–149)
VLDL Cholesterol Cal: 21 mg/dL (ref 5–40)

## 2019-08-15 NOTE — Telephone Encounter (Signed)
Left message for patient to call office to discuss results and recommendations. 

## 2019-08-15 NOTE — Telephone Encounter (Signed)
Informed patient of results and recommendations. 

## 2019-08-15 NOTE — Telephone Encounter (Signed)
-----   Message from Mellody Dance, DO sent at 08/15/2019  8:27 AM EDT ----- A1c much improved from prior.  Continue current dose metformin -Also LDL went from 173 7 months ago down to 73 now.  This is great.  Also, triglycerides have gone down from 183  To 119 and HDL has actually gone up from 59 up to 75---> these are much better numbers than prior so please continue current medications as prescribed -Also her liver function has completely normalized with the recent changes we made with medications, in lifestyle, and diet etc. -This is all great news.  Continue current medications as prescribed and follow-up as we discussed last OV

## 2019-08-15 NOTE — Telephone Encounter (Signed)
-----   Message from Deborah Opalski, DO sent at 08/15/2019  8:27 AM EDT ----- A1c much improved from prior.  Continue current dose metformin -Also LDL went from 173 7 months ago down to 73 now.  This is great.  Also, triglycerides have gone down from 183  To 119 and HDL has actually gone up from 59 up to 75---> these are much better numbers than prior so please continue current medications as prescribed -Also her liver function has completely normalized with the recent changes we made with medications, in lifestyle, and diet etc. -This is all great news.  Continue current medications as prescribed and follow-up as we discussed last OV 

## 2019-08-20 LAB — CYTOLOGY - PAP
Diagnosis: NEGATIVE
High risk HPV: NEGATIVE

## 2019-08-21 ENCOUNTER — Telehealth: Payer: Self-pay

## 2019-08-21 NOTE — Telephone Encounter (Signed)
Patient informed of pap results and recommendations.

## 2019-08-21 NOTE — Telephone Encounter (Signed)
-----   Message from Mellody Dance, DO sent at 08/20/2019  5:17 PM EDT ----- Please let patient know that her Pap smear was normal.  If patient is at average risk-no family history of female-only cancers etc.-and with negative HPV, technically she can go 3 to 5 years before next repeat.

## 2019-09-06 DIAGNOSIS — H524 Presbyopia: Secondary | ICD-10-CM | POA: Diagnosis not present

## 2019-09-06 DIAGNOSIS — E119 Type 2 diabetes mellitus without complications: Secondary | ICD-10-CM | POA: Diagnosis not present

## 2019-09-06 LAB — HM DIABETES EYE EXAM

## 2019-09-07 ENCOUNTER — Ambulatory Visit (AMBULATORY_SURGERY_CENTER): Payer: Self-pay

## 2019-09-07 ENCOUNTER — Other Ambulatory Visit: Payer: Self-pay

## 2019-09-07 ENCOUNTER — Encounter: Payer: Self-pay | Admitting: Gastroenterology

## 2019-09-07 VITALS — Temp 96.8°F | Ht 63.0 in | Wt 184.0 lb

## 2019-09-07 DIAGNOSIS — Z1211 Encounter for screening for malignant neoplasm of colon: Secondary | ICD-10-CM

## 2019-09-07 MED ORDER — NA SULFATE-K SULFATE-MG SULF 17.5-3.13-1.6 GM/177ML PO SOLN: 1.0000 | Freq: Once | ORAL | 0 refills | Status: AC

## 2019-09-07 NOTE — Progress Notes (Signed)
Denies allergies to eggs or soy products. Denies complication of anesthesia or sedation. Denies use of weight loss medication. Denies use of O2.   Emmi instructions given for colonoscopy.  Covid screening is scheduled for Tuesday 09/18/19 @ 10:35 Am. A 15.00 coupon for Suprep was given to the patient.

## 2019-09-18 ENCOUNTER — Other Ambulatory Visit: Payer: Self-pay | Admitting: Gastroenterology

## 2019-09-18 DIAGNOSIS — Z1159 Encounter for screening for other viral diseases: Secondary | ICD-10-CM | POA: Diagnosis not present

## 2019-09-18 LAB — SARS CORONAVIRUS 2 (TAT 6-24 HRS): SARS Coronavirus 2: NEGATIVE

## 2019-09-21 ENCOUNTER — Other Ambulatory Visit: Payer: Self-pay

## 2019-09-21 ENCOUNTER — Encounter: Payer: Self-pay | Admitting: Gastroenterology

## 2019-09-21 ENCOUNTER — Ambulatory Visit (AMBULATORY_SURGERY_CENTER): Payer: BC Managed Care – PPO | Admitting: Gastroenterology

## 2019-09-21 VITALS — BP 104/65 | HR 69 | Temp 98.0°F | Resp 15 | Ht 63.0 in | Wt 184.0 lb

## 2019-09-21 DIAGNOSIS — Z1211 Encounter for screening for malignant neoplasm of colon: Secondary | ICD-10-CM | POA: Diagnosis not present

## 2019-09-21 MED ORDER — SODIUM CHLORIDE 0.9 % IV SOLN: 500.0000 mL | Freq: Once | INTRAVENOUS | Status: DC

## 2019-09-21 NOTE — Op Note (Signed)
[facility] Patient Name: Abigail Payne Procedure Date: 09/21/2019 7:41 AM MRN: [id number] Endoscopist: Napoleon FormKavitha V. Nandigam , MD Age: 53 Referring MD:  Date of Birth: [birth year] Gender: Female Account #: [id number] Procedure:                Colonoscopy Indications:              Screening for colorectal malignant neoplasm Medicines:                Monitored Anesthesia Care Procedure:                Pre-Anesthesia Assessment:                           - Prior to the procedure, a History and Physical                            was performed, and patient medications and                            allergies were reviewed. The patient's tolerance of                            previous anesthesia was also reviewed. The risks                            and benefits of the procedure and the sedation                            options and risks were discussed with the patient.                            All questions were answered, and informed consent                            was obtained. Prior Anticoagulants: The patient has                            taken no previous anticoagulant or antiplatelet                            agents. ASA Grade Assessment: II - A patient with                            mild systemic disease. After reviewing the risks                            and benefits, the patient was deemed in                            satisfactory condition to undergo the procedure.                           After obtaining informed consent, the colonoscope  was passed under direct vision. Throughout the                            procedure, the patient's blood pressure, pulse, and                            oxygen saturations were monitored continuously. The                            Colonoscope was introduced through the anus and                            advanced to the the cecum, identified by                            appendiceal orifice  and ileocecal valve. The                            colonoscopy was performed without difficulty. The                            patient tolerated the procedure well. The quality                            of the bowel preparation was excellent. The                            ileocecal valve, appendiceal orifice, and rectum                            were photographed. Scope In: 8:08:37 AM Scope Out: 8:24:16 AM Scope Withdrawal Time: 0 hours 11 minutes 57 seconds  Total Procedure Duration: 0 hours 15 minutes 39 seconds  Findings:                 The perianal and digital rectal examinations were                            normal.                           Non-bleeding internal hemorrhoids were found during                            retroflexion. The hemorrhoids were small.                           The exam was otherwise without abnormality. Complications:            No immediate complications. Estimated Blood Loss:     Estimated blood loss: none. Impression:               - Non-bleeding internal hemorrhoids.                           - The examination was otherwise normal.                           -  No specimens collected. Recommendation:           - Patient has a contact number available for                            emergencies. The signs and symptoms of potential                            delayed complications were discussed with the                            patient. Return to normal activities tomorrow.                            Written discharge instructions were provided to the                            patient.                           - Resume previous diet.                           - Continue present medications.                           - Repeat colonoscopy in 10 years for screening                            purposes. Napoleon Form, MD 09/21/2019 8:29:56 AM This report has been signed electronically.

## 2019-09-21 NOTE — Progress Notes (Signed)
No problems noted in the recovery room. maw 

## 2019-09-21 NOTE — Progress Notes (Signed)
To PACU, VSS. Report to [email or web address] 

## 2019-09-21 NOTE — Patient Instructions (Addendum)
YOU HAD AN ENDOSCOPIC PROCEDURE TODAY AT [facility]:   Refer to the procedure report that was given to you for any specific questions about what was found during the examination.  If the procedure report does not answer your questions, please call your gastroenterologist to clarify.  If you requested that your care partner not be given the details of your procedure findings, then the procedure report has been included in a sealed envelope for you to review at your convenience later.  YOU SHOULD EXPECT: Some feelings of bloating in the abdomen. Passage of more gas than usual.  Walking can help get rid of the air that was put into your GI tract during the procedure and reduce the bloating. If you had a lower endoscopy (such as a colonoscopy or flexible sigmoidoscopy) you may notice spotting of blood in your stool or on the toilet paper. If you underwent a bowel prep for your procedure, you may not have a normal bowel movement for a few days.  Please Note:  You might notice some irritation and congestion in your nose or some drainage.  This is from the oxygen used during your procedure.  There is no need for concern and it should clear up in a day or so.  SYMPTOMS TO REPORT IMMEDIATELY:   Following lower endoscopy (colonoscopy or flexible sigmoidoscopy):  Excessive amounts of blood in the stool  Significant tenderness or worsening of abdominal pains  Swelling of the abdomen that is new, acute  Fever of 100F or higher   For urgent or emergent issues, a gastroenterologist can be reached at any hour by calling [phone].   DIET:  We do recommend a small meal at first, but then you may proceed to your regular diet.  Drink plenty of fluids but you should avoid alcoholic beverages for 24 hours.  ACTIVITY:  You should plan to take it easy for the rest of today and you should NOT DRIVE or use heavy machinery until tomorrow (because of the sedation medicines used during the test).     FOLLOW UP: Our staff will call the number listed on your records 48-72 hours following your procedure to check on you and address any questions or concerns that you may have regarding the information given to you following your procedure. If we do not reach you, we will leave a message.  We will attempt to reach you two times.  During this call, we will ask if you have developed any symptoms of COVID 19. If you develop any symptoms (ie: fever, flu-like symptoms, shortness of breath, cough etc.) before then, please call [phone].  If you test positive for Covid 19 in the 2 weeks post procedure, please call and report this information to [place].    If any biopsies were taken you will be contacted by phone or by letter within the next 1-3 weeks.  Please call us at [phone] if you have not heard about the biopsies in 3 weeks.    SIGNATURES/CONFIDENTIALITY: You and/or your care partner have signed paperwork which will be entered into your electronic medical record.  These signatures attest to the fact that that the information above on your After Visit Summary has been reviewed and is understood.  Full responsibility of the confidentiality of this discharge information lies with you and/or your care-partner.   Handout was given to you on Hemorrhoids. You may resume your current medications today. Your blood sugar was 101 in the recovery room. Repeat colonoscopy  in in 10 years for screening. Please call if any questions or concerns.

## 2019-09-25 ENCOUNTER — Telehealth: Payer: Self-pay

## 2019-09-25 NOTE — Telephone Encounter (Signed)
No answer, left message to call back later today, B.Schwartz RN. 

## 2019-10-22 ENCOUNTER — Other Ambulatory Visit: Payer: Self-pay | Admitting: Family Medicine

## 2019-10-22 DIAGNOSIS — E1159 Type 2 diabetes mellitus with other circulatory complications: Secondary | ICD-10-CM

## 2019-10-22 DIAGNOSIS — I152 Hypertension secondary to endocrine disorders: Secondary | ICD-10-CM

## 2019-10-22 DIAGNOSIS — E782 Mixed hyperlipidemia: Secondary | ICD-10-CM

## 2019-10-22 DIAGNOSIS — E1169 Type 2 diabetes mellitus with other specified complication: Secondary | ICD-10-CM

## 2019-10-22 DIAGNOSIS — I10 Essential (primary) hypertension: Secondary | ICD-10-CM

## 2020-02-08 ENCOUNTER — Other Ambulatory Visit: Payer: Self-pay | Admitting: Family Medicine

## 2020-02-08 DIAGNOSIS — E782 Mixed hyperlipidemia: Secondary | ICD-10-CM

## 2020-02-08 DIAGNOSIS — E1169 Type 2 diabetes mellitus with other specified complication: Secondary | ICD-10-CM

## 2020-02-08 DIAGNOSIS — E1159 Type 2 diabetes mellitus with other circulatory complications: Secondary | ICD-10-CM

## 2020-02-08 DIAGNOSIS — I152 Hypertension secondary to endocrine disorders: Secondary | ICD-10-CM

## 2020-02-08 DIAGNOSIS — E559 Vitamin D deficiency, unspecified: Secondary | ICD-10-CM

## 2020-03-10 ENCOUNTER — Ambulatory Visit (INDEPENDENT_AMBULATORY_CARE_PROVIDER_SITE_OTHER): Payer: BC Managed Care – PPO | Admitting: Physician Assistant

## 2020-03-10 ENCOUNTER — Other Ambulatory Visit: Payer: Self-pay

## 2020-03-10 ENCOUNTER — Encounter: Payer: Self-pay | Admitting: Physician Assistant

## 2020-03-10 VITALS — BP 113/76 | HR 87 | Temp 97.9°F | Ht 63.0 in | Wt 188.2 lb

## 2020-03-10 DIAGNOSIS — I10 Essential (primary) hypertension: Secondary | ICD-10-CM | POA: Diagnosis not present

## 2020-03-10 DIAGNOSIS — I152 Hypertension secondary to endocrine disorders: Secondary | ICD-10-CM

## 2020-03-10 DIAGNOSIS — E1169 Type 2 diabetes mellitus with other specified complication: Secondary | ICD-10-CM | POA: Diagnosis not present

## 2020-03-10 DIAGNOSIS — E782 Mixed hyperlipidemia: Secondary | ICD-10-CM

## 2020-03-10 DIAGNOSIS — E1159 Type 2 diabetes mellitus with other circulatory complications: Secondary | ICD-10-CM

## 2020-03-10 DIAGNOSIS — E559 Vitamin D deficiency, unspecified: Secondary | ICD-10-CM | POA: Diagnosis not present

## 2020-03-10 LAB — POCT GLYCOSYLATED HEMOGLOBIN (HGB A1C): Hemoglobin A1C: 5.9 % — AB (ref 4.0–5.6)

## 2020-03-10 MED ORDER — METFORMIN HCL 1000 MG PO TABS: 1000.0000 mg | ORAL_TABLET | Freq: Two times a day (BID) | ORAL | 1 refills | Status: DC

## 2020-03-10 MED ORDER — VITAMIN D (ERGOCALCIFEROL) 1.25 MG (50000 UNIT) PO CAPS: 50000.0000 [IU] | ORAL_CAPSULE | ORAL | 0 refills | Status: DC

## 2020-03-10 MED ORDER — ATORVASTATIN CALCIUM 80 MG PO TABS: 80.0000 mg | ORAL_TABLET | Freq: Every day | ORAL | 1 refills | Status: DC

## 2020-03-10 MED ORDER — OLMESARTAN-AMLODIPINE-HCTZ 20-5-12.5 MG PO TABS: 1.0000 | ORAL_TABLET | Freq: Every day | ORAL | 1 refills | Status: DC

## 2020-03-10 NOTE — Progress Notes (Signed)
Established Patient Office Visit  Subjective:  Patient ID: Abigail Payne, female    DOB: [birth year]  Age: 54 y.o. MRN: [id number]  CC:  Chief Complaint  Patient presents with  . Diabetes    HPI Abigail Payne presents for follow-up on diabetes, hypertension, and hyperlipidemia. Patient requests medication refills and has no other concerns.   Diabetes Melllitus: - Pt doesn't check glucose at home. - Patient reports good compliance with therapy plan: medication and lifestyle modification - She denies acute concerns or problems related to treatment plan. - She denies hypoglycemic events, increased urination or thirst. Last A1C in the office was:  Lab Results  Component Value Date   HGBA1C 5.9 (A) 03/10/2020   HGBA1C 6.1 (H) 08/14/2019   HGBA1C 6.7 (A) 04/26/2019   Lab Results  Component Value Date   MICROALBUR 10 01/11/2019   [place] 73 08/14/2019   CREATININE 0.75 08/14/2019   Hypertension: - Pt denies chest pain, palpitations, dizziness, or leg swelling. - Taking medication as directed without side effects. - Checks BP at home once weekly and readings range 110s/70s. - Reports trying to be more active and follow low salt diet.  BP Readings from Last 3 Encounters:  03/10/20 113/76  09/21/19 104/65  08/14/19 114/77   Hyperlipidemia: - Reports medication compliance. - Denies side effects including myalgias or RUQ pain. - States trying to be more consistent with following low cholesterol diet.  Wt Readings from Last 3 Encounters:  03/10/20 188 lb 3.2 oz (85.4 kg)  09/21/19 184 lb (83.5 kg)  09/07/19 184 lb (83.5 kg)     Past Medical History:  Diagnosis Date  . Anemia   . Diabetes mellitus without complication ([place])   . Hyperlipidemia   . Hypertension     Past Surgical History:  Procedure Laterality Date  . BACK SURGERY    . CARPAL TUNNEL RELEASE Left   . CESAREAN SECTION     Two C- Sections  . KNEE SURGERY Right   . MOUTH SURGERY    .  TONSILLECTOMY AND ADENOIDECTOMY      Family History  Problem Relation Age of Onset  . Diabetes Mother   . Hyperlipidemia Mother   . Hypertension Mother   . Heart attack Father   . Diabetes Father   . Hyperlipidemia Father   . Hypertension Father   . Colon cancer Neg Hx   . Esophageal cancer Neg Hx   . Rectal cancer Neg Hx   . Stomach cancer Neg Hx     Social History   Socioeconomic History  . Marital status: Divorced    Spouse name: Not on file  . Number of children: Not on file  . Years of education: Not on file  . Highest education level: Not on file  Occupational History  . Not on file  Tobacco Use  . Smoking status: Never Smoker  . Smokeless tobacco: Never Used  . Tobacco comment: Vape's  Substance and Sexual Activity  . Alcohol use: Yes    Comment: 5 beers daily on an avg  . Drug use: Never  . Sexual activity: Yes    Birth control/protection: None  Other Topics Concern  . Not on file  Social History Narrative  . Not on file   Social Determinants of Health   Financial Resource Strain:   . Difficulty of Paying Living Expenses:   Food Insecurity:   . Worried About Charity fundraiser in the Last Year:   . Ran  Out of Food in the Last Year:   Transportation Needs:   . Lack of Transportation (Medical):   Marland Kitchen Lack of Transportation (Non-Medical):   Physical Activity:   . Days of Exercise per Week:   . Minutes of Exercise per Session:   Stress:   . Feeling of Stress :   Social Connections:   . Frequency of Communication with Friends and Family:   . Frequency of Social Gatherings with Friends and Family:   . Attends Religious Services:   . Active Member of Clubs or Organizations:   . Attends Archivist Meetings:   Marland Kitchen Marital Status:   Intimate Partner Violence:   . Fear of Current or Ex-Partner:   . Emotionally Abused:   Marland Kitchen Physically Abused:   . Sexually Abused:     Outpatient Medications Prior to Visit  Medication Sig Dispense Refill  .  atorvastatin (LIPITOR) 80 MG tablet Take 1 tablet (80 mg total) by mouth at bedtime. **PATIENT NEEDS APT FOR FURTHER REFILLS** 30 tablet 0  . metFORMIN (GLUCOPHAGE) 1000 MG tablet Take 1 tablet (1,000 mg total) by mouth 2 (two) times daily with a meal. **PATIENT NEEDS APT FOR FURTHER REFILLS** 60 tablet 0  . Olmesartan-amLODIPine-HCTZ 20-5-12.5 MG TABS Take 1 tablet by mouth daily. **PATIENT NEEDS APT FOR FURTHER REFILLS** 30 tablet 0  . Vitamin D, Ergocalciferol, (DRISDOL) 1.25 MG (50000 UNIT) CAPS capsule TAKE 1 CAPSULE BY MOUTH ONCE WEEKLY 4 capsule 0   No facility-administered medications prior to visit.    No Known Allergies  ROS Review of Systems  Review of Systems: General:  Denies fever, chills, unexplained weight loss.  Optho/Auditory:  Denies visual changes, blurred vision/LOV Respiratory:  Denies SOB, DOE more than baseline levels.  Cardiovascular:  Denies chest pain, palpitations, new onset peripheral edema  Gastrointestinal:  Denies nausea, vomiting, diarrhea.  Genitourinary: Denies dysuria, freq/ urgency, flank pain Endocrine:  Denies hot or cold intolerance Musculoskeletal: Denies unexplained myalgias, joint swelling, unexplained arthralgias, gait problems.  Skin:  Denies rash, suspicious lesions Neurological: Denies dizziness, unexplained weakness, numbness  Psychiatric/Behavioral: Denies mood changes, suicidal or homicidal ideations, hallucinations  Objective:    Physical Exam General: Well nourished, in no apparent distress. Eyes: PERRLA, EOMs, conjunctiva clr Resp: Respiratory effort- normal, ECTA B/L w/o W/R/R  Cardio: RRR w/o MRGs. Abdomen: no gross distention. Lymphatics:  less 2 sec cap RF M-sk: Full ROM, 5/5 strength, normal gait.  Skin: Warm, dry  Neuro: Alert, Oriented Psych: Normal affect, Insight and Judgment appropriate.   BP 113/76   Pulse 87   Temp 97.9 F (36.6 C) (Oral)   Ht 5' 3"  (1.6 m)   Wt 188 lb 3.2 oz (85.4 kg)   SpO2 97% Comment:  on RA  BMI 33.34 kg/m  Wt Readings from Last 3 Encounters:  03/10/20 188 lb 3.2 oz (85.4 kg)  09/21/19 184 lb (83.5 kg)  09/07/19 184 lb (83.5 kg)     Health Maintenance Due  Topic Date Due  . PNEUMOCOCCAL POLYSACCHARIDE VACCINE AGE 44-64 HIGH RISK  Never done  . HIV Screening  Never done  . TETANUS/TDAP  Never done  . MAMMOGRAM  Never done  . COVID-19 Vaccine (2 - Pfizer 2-dose series) 03/07/2020    There are no preventive care reminders to display for this patient.  Lab Results  Component Value Date   TSH 1.460 01/09/2019   Lab Results  Component Value Date   WBC 4.9 01/09/2019   HGB 14.7 01/09/2019   HCT  43.8 01/09/2019   MCV 92 01/09/2019   PLT 272 01/09/2019   Lab Results  Component Value Date   NA 138 08/14/2019   K 4.4 08/14/2019   CO2 26 08/14/2019   GLUCOSE 135 (H) 08/14/2019   BUN 16 08/14/2019   CREATININE 0.75 08/14/2019   BILITOT 0.5 08/14/2019   ALKPHOS 97 08/14/2019   AST 22 08/14/2019   ALT 29 08/14/2019   PROT 7.4 08/14/2019   ALBUMIN 5.0 (H) 08/14/2019   CALCIUM 9.7 08/14/2019   Lab Results  Component Value Date   CHOL 169 08/14/2019   Lab Results  Component Value Date   HDL 75 08/14/2019   Lab Results  Component Value Date   LDLCALC 73 08/14/2019   Lab Results  Component Value Date   TRIG 119 08/14/2019   Lab Results  Component Value Date   CHOLHDL 2.3 08/14/2019   Lab Results  Component Value Date   HGBA1C 5.9 (A) 03/10/2020      Assessment & Plan:   Problem List Items Addressed This Visit      Cardiovascular and Mediastinum   Hypertension associated with diabetes ([place])   Relevant Medications   atorvastatin (LIPITOR) 80 MG tablet   metFORMIN (GLUCOPHAGE) 1000 MG tablet   Olmesartan-amLODIPine-HCTZ 20-5-12.5 MG TABS     Endocrine   Mixed diabetic hyperlipidemia associated with type 2 diabetes mellitus (HCC)   Relevant Medications   atorvastatin (LIPITOR) 80 MG tablet   metFORMIN (GLUCOPHAGE) 1000 MG tablet    Olmesartan-amLODIPine-HCTZ 20-5-12.5 MG TABS   Type 2 diabetes mellitus with other specified complication (HCC) - Primary   Relevant Medications   atorvastatin (LIPITOR) 80 MG tablet   metFORMIN (GLUCOPHAGE) 1000 MG tablet   Olmesartan-amLODIPine-HCTZ 20-5-12.5 MG TABS   Other Relevant Orders   POCT glycosylated hemoglobin (Hb A1C) (Completed)     Other   Vitamin D deficiency   Relevant Medications   Vitamin D, Ergocalciferol, (DRISDOL) 1.25 MG (50000 UNIT) CAPS capsule   Other Relevant Orders   Vitamin D (25 hydroxy)     T2DM: - A1c 5.9, at goal. - Continue Metformin 1000 BID. - Encourage to follow low carb diet. - Pt declined rx for glucometer kit. States cheaper to buy OTC.  HTN: - BP today is 113/74, at goal. - Continue Olmesartan-Amlodipine-HCTZ.  - Continue ambulatory BP and pulse monitoring and keep a log. - Continue to follow low salt diet.  Hyperlidpidemia: - Last lipid panel wnl's. - Continue Atorvastatin 80 mg. - Encourage to follow heart healthy diet. - Encourage to stay as active as possible.  Vitamin D deficiency: - Placed future order to check Vitamin D level.   Meds ordered this encounter  Medications  . Vitamin D, Ergocalciferol, (DRISDOL) 1.25 MG (50000 UNIT) CAPS capsule    Sig: Take 1 capsule (50,000 Units total) by mouth once a week.    Dispense:  4 capsule    Refill:  0  . atorvastatin (LIPITOR) 80 MG tablet    Sig: Take 1 tablet (80 mg total) by mouth at bedtime.    Dispense:  90 tablet    Refill:  1    Order Specific Question:   Supervising Provider    AnswerMellody Dance [id number]  . metFORMIN (GLUCOPHAGE) 1000 MG tablet    Sig: Take 1 tablet (1,000 mg total) by mouth 2 (two) times daily with a meal.    Dispense:  180 tablet    Refill:  1    Order Specific  Question:   Supervising Provider    Answer:   Mellody Dance [[id number]]  . Olmesartan-amLODIPine-HCTZ 20-5-12.5 MG TABS    Sig: Take 1 tablet by mouth daily.    Dispense:  90  tablet    Refill:  1    Order Specific Question:   Supervising Provider    AnswerMellody Dance [[id number]]    Follow-up: Return for CPE and FBW in 5-6 months.    Lorrene Reid, PA-C

## 2020-03-10 NOTE — Patient Instructions (Signed)

## 2020-08-11 ENCOUNTER — Ambulatory Visit (INDEPENDENT_AMBULATORY_CARE_PROVIDER_SITE_OTHER): Payer: BC Managed Care – PPO | Admitting: Physician Assistant

## 2020-08-11 ENCOUNTER — Other Ambulatory Visit: Payer: Self-pay

## 2020-08-11 ENCOUNTER — Encounter: Payer: Self-pay | Admitting: Physician Assistant

## 2020-08-11 VITALS — BP 124/85 | HR 98 | Ht 63.0 in | Wt 205.3 lb

## 2020-08-11 DIAGNOSIS — E1159 Type 2 diabetes mellitus with other circulatory complications: Secondary | ICD-10-CM | POA: Diagnosis not present

## 2020-08-11 DIAGNOSIS — Z0000 Encounter for general adult medical examination without abnormal findings: Secondary | ICD-10-CM | POA: Diagnosis not present

## 2020-08-11 DIAGNOSIS — E782 Mixed hyperlipidemia: Secondary | ICD-10-CM | POA: Diagnosis not present

## 2020-08-11 DIAGNOSIS — I10 Essential (primary) hypertension: Secondary | ICD-10-CM | POA: Diagnosis not present

## 2020-08-11 DIAGNOSIS — E559 Vitamin D deficiency, unspecified: Secondary | ICD-10-CM | POA: Diagnosis not present

## 2020-08-11 DIAGNOSIS — L819 Disorder of pigmentation, unspecified: Secondary | ICD-10-CM

## 2020-08-11 DIAGNOSIS — Z1231 Encounter for screening mammogram for malignant neoplasm of breast: Secondary | ICD-10-CM

## 2020-08-11 DIAGNOSIS — Z23 Encounter for immunization: Secondary | ICD-10-CM

## 2020-08-11 DIAGNOSIS — I152 Hypertension secondary to endocrine disorders: Secondary | ICD-10-CM

## 2020-08-11 DIAGNOSIS — E1169 Type 2 diabetes mellitus with other specified complication: Secondary | ICD-10-CM

## 2020-08-11 DIAGNOSIS — Z6836 Body mass index (BMI) 36.0-36.9, adult: Secondary | ICD-10-CM

## 2020-08-11 DIAGNOSIS — Z1283 Encounter for screening for malignant neoplasm of skin: Secondary | ICD-10-CM

## 2020-08-11 NOTE — Progress Notes (Signed)
Female Physical   Impression and Recommendations:    1. Healthcare maintenance   2. Type 2 diabetes mellitus with other specified complication, without long-term current use of insulin (HCC)   3. Hypertension associated with diabetes (HCC)   4. Mixed diabetic hyperlipidemia associated with type 2 diabetes mellitus (HCC)   5. Vitamin D deficiency   6. Encounter for screening mammogram for malignant neoplasm of breast   7. Need for influenza vaccination   8. Skin cancer screening   9. Atypical pigmented skin lesion      1) Anticipatory Guidance: Discussed skin CA prevention and sunscreen when outside along with skin surveillance; eating a balanced and modest diet; physical activity at least 25 minutes per day or minimum of 150 min/ week moderate to intense activity.  2) Immunizations / Screenings / Labs:   All immunizations are up-to-date per recommendations or will be updated today if pt allows.    - Patient understands with dental and vision screens they will schedule independently.  - Will obtain CBC, CMP, HgA1c, direct LDL (pt non fasting), TSH and vit D.  - UTD on pap smear, colonoscopy, Tdap - Placed order for mammogram. - Declined Hep C and HIV screenings - Pt wants to get Shingrix vaccine in the near future.  3) Weight:  BMI meaning discussed with patient.  Discussed goal to improve diet habits to improve overall feelings of well being and objective health data. Improve nutrient density of diet through increasing intake of fruits and vegetables and decreasing saturated fats, white flour products and refined sugars. -BMI 36.37, associated with diabetes mellitus, hypertension and hyperlipidemia.  4) Healthcare Maintenance: -Continue current medication regimen. -Follow a heart healthy diet and increase physical activity.  -Stay well hydrated. -Foot exam performed today, wnl -Recommend to reduce vaping and limit alcohol drink to 1 per day and no more than 7 per week. -Placed  dermatology referral for skin cancer screening and evaluation of atypical skin lesion on lower lip. -Follow up in 4 months for reg OV: DM, HTN, HLD   No orders of the defined types were placed in this encounter.   Orders Placed This Encounter  Procedures  . MM Digital Screening  . Flu Vaccine QUAD 6+ mos PF IM (Fluarix Quad PF)  . CBC  . Comprehensive metabolic panel  . Hemoglobin A1c  . TSH  . Direct LDL  . VITAMIN D 25 Hydroxy (Vit-D Deficiency, Fractures)  . Urine Microalbumin w/creat. ratio  . Ambulatory referral to Dermatology     Return in about 4 months (around 12/11/2020) for DM, HTN, HLD.    Gross side effects, risk and benefits, and alternatives of medications discussed with patient.  Patient is aware that all medications have potential side effects and we are unable to predict every side effect or drug-drug interaction that may occur.  Expresses verbal understanding and consents to current therapy plan and treatment regimen.  F-up preventative CPE in 1 year- this is in addition to any chronic care visits.    Please see orders placed and AVS handed out to patient at the end of our visit for further patient instructions/ counseling done pertaining to today's office visit.  Note:  This note was prepared with assistance of Dragon voice recognition software. Occasional wrong-word or sound-a-like substitutions may have occurred due to the inherent limitations of voice recognition software.     Subjective:     CPE HPI: Abigail Payne is a 54 y.o. female who presents to [facility] today for a yearly health maintenance exam.   Health Maintenance Summary  - Reviewed and updated, unless pt declines services.  Last Cologuard or Colonoscopy:   09/21/19- repeat in 10 years Tobacco History Reviewed:  Y, never smoker Alcohol and/or drug use:  3-5 beers on weekends and few during the week; no use Exercise Habits: Not currently   Dental Home:  Y Eye exams: Y Dermatology home: N, placed referral  Female Health:  PAP Smear - last known results: 08/14/19- negative, followed by Ob-Gyn STD concerns:   none Lumps or breast concerns:  none Breast Cancer Family History:   No      Immunization History  Administered Date(s) Administered  . PFIZER SARS-COV-2 Vaccination 02/15/2020  . Tdap 07/25/2013     Health Maintenance  Topic Date Due  . Hepatitis C Screening  Never done  . PNEUMOCOCCAL POLYSACCHARIDE VACCINE AGE 55-64 HIGH RISK  Never done  . HIV Screening  Never done  . MAMMOGRAM  Never done  . COVID-19 Vaccine (2 - Pfizer 2-dose series) 03/07/2020  . INFLUENZA VACCINE  Never done  . FOOT EXAM  08/13/2020  . OPHTHALMOLOGY EXAM  09/05/2020  . HEMOGLOBIN A1C  09/09/2020  . PAP SMEAR-Modifier  08/13/2022  . TETANUS/TDAP  07/26/2023  . COLONOSCOPY  09/20/2029     Wt Readings from Last 3 Encounters:  08/11/20 205 lb 4.8 oz (93.1 kg)  03/10/20 188 lb 3.2 oz (85.4 kg)  09/21/19 184 lb (83.5 kg)   BP Readings from Last 3 Encounters:  08/11/20 124/85  03/10/20 113/76  09/21/19 104/65   Pulse Readings from Last 3 Encounters:  08/11/20 98  03/10/20 87  09/21/19 69     Past Medical History:  Diagnosis Date  . Anemia   . Diabetes mellitus without complication (HCC)   . Hyperlipidemia   . Hypertension       Past Surgical History:  Procedure Laterality Date  . BACK SURGERY    . CARPAL TUNNEL RELEASE Left   . CESAREAN SECTION     Two C- Sections  . KNEE SURGERY Right   . MOUTH SURGERY    . TONSILLECTOMY AND ADENOIDECTOMY        Family History  Problem Relation Age of Onset  . Diabetes Mother   . Hyperlipidemia Mother   . Hypertension Mother   . Heart attack Father   . Diabetes Father   . Hyperlipidemia Father   . Hypertension Father   . Colon cancer Neg Hx   . Esophageal cancer Neg Hx   . Rectal cancer Neg Hx   . Stomach cancer Neg Hx       Social History   Substance and Sexual  Activity  Drug Use Never  ,   Social History   Substance and Sexual Activity  Alcohol Use Yes   Comment: 5 beers daily on an avg  ,   Social History   Tobacco Use  Smoking Status Never Smoker  Smokeless Tobacco Never Used  Tobacco Comment   Vape's  ,   Social History   Substance and Sexual Activity  Sexual Activity Yes  . Birth control/protection: None    Current Outpatient Medications on File Prior to Visit  Medication Sig Dispense Refill  . atorvastatin (LIPITOR) 80 MG tablet Take 1 tablet (80 mg total) by mouth at bedtime. 90 tablet 1  . metFORMIN (GLUCOPHAGE) 1000 MG tablet Take 1 tablet (1,000 mg total) by mouth 2 (two) times daily with  a meal. 180 tablet 1  . Olmesartan-amLODIPine-HCTZ 20-5-12.5 MG TABS Take 1 tablet by mouth daily. 90 tablet 1  . Vitamin D, Ergocalciferol, (DRISDOL) 1.25 MG (50000 UNIT) CAPS capsule Take 1 capsule (50,000 Units total) by mouth once a week. 4 capsule 0   No current facility-administered medications on file prior to visit.    Allergies: Patient has no known allergies.  Review of Systems: General:   Denies fever, chills, unexplained weight loss.  Optho/Auditory:   Denies visual changes, blurred vision/LOV Respiratory:   Denies SOB, DOE more than baseline levels.   Cardiovascular:   Denies chest pain, palpitations, new onset peripheral edema  Gastrointestinal:   Denies nausea, vomiting, diarrhea.  Genitourinary: Denies dysuria, freq/ urgency, flank pain or discharge from genitals.  Endocrine:     Denies hot or cold intolerance, polyuria, polydipsia. Musculoskeletal:   Denies unexplained myalgias, joint swelling, unexplained arthralgias, gait problems.  Skin:  Denies rash, suspicious lesions Neurological:     Denies dizziness, unexplained weakness, numbness  Psychiatric/Behavioral:   Denies mood changes, suicidal or homicidal ideations, hallucinations    Objective:    Blood pressure 124/85, pulse 98, height 5\' 3"  (1.6 m),  weight 205 lb 4.8 oz (93.1 kg), SpO2 96 %. Body mass index is 36.37 kg/m. General Appearance:    Alert, cooperative, no distress, appears stated age  Head:    Normocephalic, without obvious abnormality, atraumatic  Eyes:    PERRL, conjunctiva/corneas clear, EOM's intact, both eyes  Ears:    Normal TM's and external ear canals, both ears  Nose:   Nares normal, septum midline, mucosa normal, no drainage    or sinus tenderness  Throat:   Lips w/o lesion, mucosa moist, and tongue normal; teeth and   gums normal  Neck:   Supple, symmetrical, trachea midline, no adenopathy;    thyroid:  no enlargement/tenderness/nodules; no JVD  Back:     Symmetric, no curvature, ROM normal, no CVA tenderness  Lungs:     Clear to auscultation bilaterally, respirations unlabored, no       Wh/ R/ R  Chest Wall:    No tenderness or gross deformity; normal excursion   Heart:    Regular rate and rhythm, S1 and S2 normal, no murmur, rub   or gallop  Breast Exam:    Deferred to Ob-Gyn  Abdomen:     Soft, non-tender, bowel sounds active all four quadrants, No   G/R/R, no masses, no organomegaly  Genitalia:    Deferred to Ob-Gyn  Rectal:    Deferred to Ob-Gyn  Extremities:   Extremities normal, atraumatic, no cyanosis or gross edema  Pulses:   Normal  Skin:   Warm, dry, Skin color, texture, turgor normal, no obvious rashes, dark pigmented lesion on lower lip Psych: No HI/SI, judgement and insight good, Euthymic mood. Full Affect.  Neurologic:   CNII-XII grossly intact, normal strength, sensation and reflexes throughout

## 2020-08-11 NOTE — Patient Instructions (Signed)

## 2020-08-12 LAB — HEMOGLOBIN A1C
Est. average glucose Bld gHb Est-mCnc: 171 mg/dL
Hgb A1c MFr Bld: 7.6 % — ABNORMAL HIGH (ref 4.8–5.6)

## 2020-08-12 LAB — VITAMIN D 25 HYDROXY (VIT D DEFICIENCY, FRACTURES): Vit D, 25-Hydroxy: 32.3 ng/mL (ref 30.0–100.0)

## 2020-08-12 LAB — MICROALBUMIN / CREATININE URINE RATIO
Creatinine, Urine: 63.9 mg/dL
Microalb/Creat Ratio: 71 mg/g creat — ABNORMAL HIGH (ref 0–29)
Microalbumin, Urine: 45.2 ug/mL

## 2020-08-12 LAB — COMPREHENSIVE METABOLIC PANEL
ALT: 26 IU/L (ref 0–32)
AST: 20 IU/L (ref 0–40)
Albumin/Globulin Ratio: 1.7 (ref 1.2–2.2)
Albumin: 4.8 g/dL (ref 3.8–4.9)
Alkaline Phosphatase: 118 IU/L (ref 44–121)
BUN/Creatinine Ratio: 25 — ABNORMAL HIGH (ref 9–23)
BUN: 18 mg/dL (ref 6–24)
Bilirubin Total: 0.4 mg/dL (ref 0.0–1.2)
CO2: 22 mmol/L (ref 20–29)
Calcium: 9.8 mg/dL (ref 8.7–10.2)
Chloride: 98 mmol/L (ref 96–106)
Creatinine, Ser: 0.73 mg/dL (ref 0.57–1.00)
GFR calc Af Amer: 109 mL/min/{1.73_m2} (ref >59)
GFR calc non Af Amer: 94 mL/min/{1.73_m2} (ref >59)
Globulin, Total: 2.9 g/dL (ref 1.5–4.5)
Glucose: 197 mg/dL — ABNORMAL HIGH (ref 65–99)
Potassium: 4.2 mmol/L (ref 3.5–5.2)
Sodium: 135 mmol/L (ref 134–144)
Total Protein: 7.7 g/dL (ref 6.0–8.5)

## 2020-08-12 LAB — CBC
Hematocrit: 40.8 % (ref 34.0–46.6)
Hemoglobin: 13.9 g/dL (ref 11.1–15.9)
MCH: 30.8 pg (ref 26.6–33.0)
MCHC: 34.1 g/dL (ref 31.5–35.7)
MCV: 90 fL (ref 79–97)
Platelets: 325 10*3/uL (ref 150–450)
RBC: 4.52 x10E6/uL (ref 3.77–5.28)
RDW: 11.9 % (ref 11.7–15.4)
WBC: 7 10*3/uL (ref 3.4–10.8)

## 2020-08-12 LAB — TSH: TSH: 2.85 u[IU]/mL (ref 0.450–4.500)

## 2020-08-12 LAB — LDL CHOLESTEROL, DIRECT: LDL Direct: 101 mg/dL — ABNORMAL HIGH (ref 0–99)

## 2020-08-13 ENCOUNTER — Other Ambulatory Visit: Payer: Self-pay | Admitting: Physician Assistant

## 2020-08-13 DIAGNOSIS — Z1231 Encounter for screening mammogram for malignant neoplasm of breast: Secondary | ICD-10-CM

## 2020-08-29 ENCOUNTER — Other Ambulatory Visit: Payer: Self-pay | Admitting: Physician Assistant

## 2020-08-29 ENCOUNTER — Ambulatory Visit
Admission: RE | Admit: 2020-08-29 | Discharge: 2020-08-29 | Disposition: A | Discharge status: Home / Self Care | Payer: BC Managed Care – PPO | Source: Ambulatory Visit | Attending: Physician Assistant | Admitting: Physician Assistant

## 2020-08-29 ENCOUNTER — Other Ambulatory Visit: Payer: Self-pay

## 2020-08-29 DIAGNOSIS — Z1231 Encounter for screening mammogram for malignant neoplasm of breast: Secondary | ICD-10-CM | POA: Diagnosis not present

## 2020-09-08 DIAGNOSIS — E119 Type 2 diabetes mellitus without complications: Secondary | ICD-10-CM | POA: Diagnosis not present

## 2020-09-08 DIAGNOSIS — H524 Presbyopia: Secondary | ICD-10-CM | POA: Diagnosis not present

## 2020-09-12 LAB — HM DIABETES EYE EXAM

## 2020-09-16 ENCOUNTER — Encounter: Payer: Self-pay | Admitting: Physician Assistant

## 2020-11-22 ENCOUNTER — Other Ambulatory Visit: Payer: BC Managed Care – PPO

## 2020-11-22 DIAGNOSIS — Z20822 Contact with and (suspected) exposure to covid-19: Secondary | ICD-10-CM

## 2020-11-25 LAB — NOVEL CORONAVIRUS, NAA: SARS-CoV-2, NAA: DETECTED — AB

## 2020-12-11 ENCOUNTER — Ambulatory Visit (INDEPENDENT_AMBULATORY_CARE_PROVIDER_SITE_OTHER): Payer: BC Managed Care – PPO | Admitting: Physician Assistant

## 2020-12-11 ENCOUNTER — Encounter: Payer: Self-pay | Admitting: Physician Assistant

## 2020-12-11 ENCOUNTER — Other Ambulatory Visit: Payer: Self-pay

## 2020-12-11 VITALS — BP 157/87 | Ht 63.0 in | Wt 210.0 lb

## 2020-12-11 DIAGNOSIS — E1169 Type 2 diabetes mellitus with other specified complication: Secondary | ICD-10-CM

## 2020-12-11 NOTE — Progress Notes (Signed)
Attempted to contact patient multiple times with no answer. LVM. No charge visit.  Telehealth office visit note for Mayer Masker, PA-C- at [facility] at [facility]   I connected with current patient today by telephone and verified that I am speaking with the correct person   . Location of the patient: Home . Location of the provider: Office - This visit type was conducted due to national recommendations for restrictions regarding the COVID-19 Pandemic (e.g. social distancing) in an effort to limit this patient's exposure and mitigate transmission in our community.    - No physical exam could be performed with this format, beyond that communicated to [place] by the patient/ family members as noted.   - Additionally my office staff/ schedulers were to discuss with the patient that there may be a monetary charge related to this service, depending on their medical insurance.  My understanding is that patient understood and consented to proceed.     _________________________________________________________________________________   History of Present Illness:      GAD 7 : Generalized Anxiety Score 05/10/2019  Nervous, Anxious, on Edge 0  Control/stop worrying 0  Worry too much - different things 0  Trouble relaxing 0  Restless 0  Easily annoyed or irritable 0  Afraid - awful might happen 0  Total GAD 7 Score 0  Anxiety Difficulty Not difficult at all    Depression screen [facility] 2/9 08/11/2020 03/10/2020 08/14/2019 05/10/2019 02/20/2019  Decreased Interest 0 0 0 0 0  Down, Depressed, Hopeless 0 0 0 0 0  PHQ - 2 Score 0 0 0 0 0  Altered sleeping 3 3 3 2  0  Tired, decreased energy 1 0 0 0 0  Change in appetite 0 0 0 0 0  Feeling bad or failure about yourself  0 0 0 0 0  Trouble concentrating 0 0 0 0 0  Moving slowly or fidgety/restless 0 0 0 0 0  Suicidal thoughts 0 0 0 0 0  PHQ-9 Score 4 3 3 2  0  Difficult doing work/chores Not difficult at all Somewhat difficult Not difficult at  all - Not difficult at all      Impression and Recommendations:     1. Type 2 diabetes mellitus with other specified complication, without long-term current use of insulin (HCC)        - As part of my medical decision making, I reviewed the following data within the electronic MEDICAL RECORD NUMBER History obtained from pt /family, CMA notes reviewed and incorporated if applicable, Labs reviewed, Radiograph/ tests reviewed if applicable and OV notes from prior OV's with me, as well as any other specialists she/he has seen since seeing me last, were all reviewed and used in my medical decision making process today.    - Additionally, when appropriate, discussion had with patient regarding our treatment plan, and their biases/concerns about that plan were used in my medical decision making today.    - The patient agreed with the plan and demonstrated an understanding of the instructions.   No barriers to understanding were identified.     - The patient was advised to call back or seek an in-person evaluation if the symptoms worsen or if the condition fails to improve as anticipated.   No follow-ups on file.    No orders of the defined types were placed in this encounter.   No orders of the defined types were placed in this encounter.   There are no discontinued medications.  Time spent on visit including pre-visit chart review and post-visit care was 0 minutes.      The 21st Century Cures Act was signed into law in 2016 which includes the topic of electronic health records.  This provides immediate access to information in MyChart.  This includes consultation notes, operative notes, office notes, lab results and pathology reports.  If you have any questions about what you read please let us know at your next visit or call us at the office.  We are right here with you.   __________________________________________________________________________________     Patient Care  Team    Relationship Specialty Notifications Start End  Mayer Masker, [place] PCP - General   03/16/20      -Vitals obtained; medications/ allergies reconciled;  personal medical, social, Sx etc.histories were updated by CMA, reviewed by me and are reflected in chart   Patient Active Problem List   Diagnosis Date Noted  . h/o Elevated alanine aminotransferase (ALT) level 02/20/2019  . Persistent microalbuminuria associated with type 2 diabetes mellitus (HCC) 01/12/2019  . Elevated liver enzymes 01/11/2019  . Vitamin D deficiency 01/11/2019  . Smoker 01/11/2019  . Excessive drinking alcohol 01/11/2019  . Mixed diabetic hyperlipidemia associated with type 2 diabetes mellitus (HCC) 12/26/2018  . Hypertension associated with diabetes (HCC) 12/26/2018  . Type 2 diabetes mellitus with other specified complication (HCC) 12/26/2018  . BMI >er 35 with chronic conditions 12/26/2018  . Noncompliance with medication regimen-patient was without insurance and unable to afford medical care or medications many months until now 12/26/2018     Current Meds  Medication Sig  . atorvastatin (LIPITOR) 80 MG tablet Take 1 tablet (80 mg total) by mouth at bedtime.  . metFORMIN (GLUCOPHAGE) 1000 MG tablet Take 1 tablet (1,000 mg total) by mouth 2 (two) times daily with a meal.  . Olmesartan-amLODIPine-HCTZ 20-5-12.5 MG TABS Take 1 tablet by mouth daily.     Allergies:  No Known Allergies   ROS:  See above HPI for pertinent positives and negatives   Objective:   Blood pressure (!) 157/87, height 5\' 3"  (1.6 m), weight 210 lb (95.3 kg).  (if some vitals are omitted, this means that patient was UNABLE to obtain them.) General: A & O * 3; sounds in no acute distress Respiratory: speaking in full sentences, no conversational dyspnea Psych: insight appears good, mood- appears full

## 2020-12-12 ENCOUNTER — Other Ambulatory Visit: Payer: BC Managed Care – PPO

## 2020-12-18 ENCOUNTER — Telehealth: Payer: Self-pay | Admitting: Physician Assistant

## 2020-12-18 DIAGNOSIS — I152 Hypertension secondary to endocrine disorders: Secondary | ICD-10-CM

## 2020-12-18 DIAGNOSIS — E782 Mixed hyperlipidemia: Secondary | ICD-10-CM

## 2020-12-18 DIAGNOSIS — E1159 Type 2 diabetes mellitus with other circulatory complications: Secondary | ICD-10-CM

## 2020-12-18 DIAGNOSIS — E1169 Type 2 diabetes mellitus with other specified complication: Secondary | ICD-10-CM

## 2020-12-18 DIAGNOSIS — E559 Vitamin D deficiency, unspecified: Secondary | ICD-10-CM

## 2020-12-18 MED ORDER — ATORVASTATIN CALCIUM 80 MG PO TABS: 80.0000 mg | ORAL_TABLET | Freq: Every day | ORAL | 0 refills | Status: DC

## 2020-12-18 MED ORDER — OLMESARTAN-AMLODIPINE-HCTZ 20-5-12.5 MG PO TABS: 1.0000 | ORAL_TABLET | Freq: Every day | ORAL | 0 refills | Status: DC

## 2020-12-18 MED ORDER — VITAMIN D (ERGOCALCIFEROL) 1.25 MG (50000 UNIT) PO CAPS: 50000.0000 [IU] | ORAL_CAPSULE | ORAL | 0 refills | Status: DC

## 2020-12-18 MED ORDER — METFORMIN HCL 1000 MG PO TABS: 1000.0000 mg | ORAL_TABLET | Freq: Two times a day (BID) | ORAL | 0 refills | Status: DC

## 2020-12-18 NOTE — Addendum Note (Signed)
Addended by: Sylvester Harder on: 12/18/2020 10:53 AM   Modules accepted: Orders

## 2020-12-18 NOTE — Telephone Encounter (Signed)
Patient called in and wanted to schedule another televisit from her previous televisit that was on 12-11-20. She stated she missed two calls. I see where the appointment was marked complete. She is scheduled for on March 1st. She also requested refills on Atorvastatin, Metformin, Olmesartan-amlodipine-HCTZ, and Vitamin D, Ergocalciferol. Patient uses [facility], thanks.

## 2020-12-18 NOTE — Telephone Encounter (Signed)
RX refills sent to pharmacy for 60 day supply per refill protocol. AS, CMA

## 2020-12-19 ENCOUNTER — Other Ambulatory Visit: Payer: BC Managed Care – PPO

## 2021-01-05 ENCOUNTER — Telehealth: Payer: Self-pay | Admitting: Physician Assistant

## 2021-01-05 NOTE — Telephone Encounter (Signed)
Patient added to schedule for tomorrow at 1015am. Pt negative for covid on 2/15. AS, CMA

## 2021-01-06 ENCOUNTER — Other Ambulatory Visit: Payer: Self-pay

## 2021-01-06 ENCOUNTER — Encounter: Payer: Self-pay | Admitting: Physician Assistant

## 2021-01-06 ENCOUNTER — Other Ambulatory Visit: Payer: BC Managed Care – PPO

## 2021-01-06 ENCOUNTER — Ambulatory Visit: Payer: BC Managed Care – PPO | Admitting: Physician Assistant

## 2021-01-06 VITALS — BP 122/81 | HR 87 | Temp 98.5°F | Ht 63.0 in | Wt 211.8 lb

## 2021-01-06 DIAGNOSIS — J0100 Acute maxillary sinusitis, unspecified: Secondary | ICD-10-CM | POA: Diagnosis not present

## 2021-01-06 DIAGNOSIS — E559 Vitamin D deficiency, unspecified: Secondary | ICD-10-CM

## 2021-01-06 DIAGNOSIS — E782 Mixed hyperlipidemia: Secondary | ICD-10-CM

## 2021-01-06 DIAGNOSIS — I152 Hypertension secondary to endocrine disorders: Secondary | ICD-10-CM

## 2021-01-06 DIAGNOSIS — J069 Acute upper respiratory infection, unspecified: Secondary | ICD-10-CM

## 2021-01-06 DIAGNOSIS — Z0000 Encounter for general adult medical examination without abnormal findings: Secondary | ICD-10-CM

## 2021-01-06 DIAGNOSIS — R5383 Other fatigue: Secondary | ICD-10-CM

## 2021-01-06 DIAGNOSIS — E1169 Type 2 diabetes mellitus with other specified complication: Secondary | ICD-10-CM | POA: Diagnosis not present

## 2021-01-06 DIAGNOSIS — E1159 Type 2 diabetes mellitus with other circulatory complications: Secondary | ICD-10-CM

## 2021-01-06 MED ORDER — AZITHROMYCIN 250 MG PO TABS: ORAL_TABLET | ORAL | 0 refills | Status: DC

## 2021-01-06 NOTE — Patient Instructions (Signed)

## 2021-01-06 NOTE — Progress Notes (Addendum)
Acute Office Visit  Subjective:    Patient ID: Abigail Payne, female    DOB: 1966/09/07, 55 y.o.   MRN: [id number]  Chief Complaint  Patient presents with  . Cough  . Nasal Congestion    HPI Patient is in today with c/o upper respiratory symptoms of sore throat, chest and head congestion, and productive cough with green sputum usually in the mornings.  Reports symptoms started last Tuesday (7 days ago) and went for Covid testing on Wednesday which resulted negative.  Has been taking Mucinex with minimal relief.  Denies fever, chills, shortness of breath, wheezing, headache or sick contact exposures.  Past Medical History:  Diagnosis Date  . Anemia   . Diabetes mellitus without complication (HCC)   . Hyperlipidemia   . Hypertension     Past Surgical History:  Procedure Laterality Date  . BACK SURGERY    . CARPAL TUNNEL RELEASE Left   . CESAREAN SECTION     Two C- Sections  . KNEE SURGERY Right   . MOUTH SURGERY    . TONSILLECTOMY AND ADENOIDECTOMY      Family History  Problem Relation Age of Onset  . Diabetes Mother   . Hyperlipidemia Mother   . Hypertension Mother   . Heart attack Father   . Diabetes Father   . Hyperlipidemia Father   . Hypertension Father   . Colon cancer Neg Hx   . Esophageal cancer Neg Hx   . Rectal cancer Neg Hx   . Stomach cancer Neg Hx     Social History   Socioeconomic History  . Marital status: Divorced    Spouse name: Not on file  . Number of children: Not on file  . Years of education: Not on file  . Highest education level: Not on file  Occupational History  . Not on file  Tobacco Use  . Smoking status: Never Smoker  . Smokeless tobacco: Never Used  . Tobacco comment: Vape's  Vaping Use  . Vaping Use: Every day  Substance and Sexual Activity  . Alcohol use: Yes    Comment: 5 beers daily on an avg  . Drug use: Never  . Sexual activity: Yes    Birth control/protection: None  Other Topics Concern  . Not on file   Social History Narrative  . Not on file   Social Determinants of Health   Financial Resource Strain: Not on file  Food Insecurity: Not on file  Transportation Needs: Not on file  Physical Activity: Not on file  Stress: Not on file  Social Connections: Not on file  Intimate Partner Violence: Not on file    Outpatient Medications Prior to Visit  Medication Sig Dispense Refill  . atorvastatin (LIPITOR) 80 MG tablet Take 1 tablet (80 mg total) by mouth at bedtime. **NEEDS APT FOR REFILLS** 60 tablet 0  . metFORMIN (GLUCOPHAGE) 1000 MG tablet Take 1 tablet (1,000 mg total) by mouth 2 (two) times daily with a meal. **NEEDS APT FOR REFILLS** 120 tablet 0  . Olmesartan-amLODIPine-HCTZ 20-5-12.5 MG TABS Take 1 tablet by mouth daily. **NEEDS APT FOR REFILLS** 60 tablet 0  . Vitamin D, Ergocalciferol, (DRISDOL) 1.25 MG (50000 UNIT) CAPS capsule Take 1 capsule (50,000 Units total) by mouth once a week. **NEEDS APT FOR REFILLS** 4 capsule 0   No facility-administered medications prior to visit.    No Known Allergies  Review of Systems A fourteen system review of systems was performed and found to be  positive as per HPI.    Objective:    Physical Exam General:  Well Developed, well nourished, appropriate for stated age.  Neuro:  Alert and oriented,  extra-ocular muscles intact  HEENT:  Normocephalic, atraumatic, TTP of maxillary sinus, no TTP of frontal sinus, clear conjunctiva, normal TM of both ears with slight fluid behind right TM, mildly erythematous posterior oropharynx w/o exudates, cervical adenopathy (slighly enlarged submandilbular gland) Skin:  no gross rash, warm, pink. Cardiac:  RRR, S1 S2 wnl's Respiratory:  ECTA B/L w/o wheezing, crackles or rales, Not using accessory muscles, speaking in full sentences- unlabored. Vascular:  Ext warm, no cyanosis apprec.; cap RF less 2 sec. Psych:  No HI/SI, judgement and insight good, Euthymic mood. Full Affect.   BP 122/81   Pulse 87    Temp 98.5 F (36.9 C)   Ht 5\' 3"  (1.6 m)   Wt 211 lb 12.8 oz (96.1 kg)   SpO2 98%   BMI 37.52 kg/m  Wt Readings from Last 3 Encounters:  01/06/21 211 lb 12.8 oz (96.1 kg)  12/11/20 210 lb (95.3 kg)  08/11/20 205 lb 4.8 oz (93.1 kg)    Health Maintenance Due  Topic Date Due  . Hepatitis C Screening  Never done  . PNEUMOCOCCAL POLYSACCHARIDE VACCINE AGE 34-64 HIGH RISK  Never done  . HIV Screening  Never done  . INFLUENZA VACCINE  Never done  . FOOT EXAM  08/13/2020  . COVID-19 Vaccine (3 - Booster for Pfizer series) 08/16/2020    There are no preventive care reminders to display for this patient.   Lab Results  Component Value Date   TSH 2.120 01/06/2021   Lab Results  Component Value Date   WBC 10.0 01/06/2021   HGB 15.6 01/06/2021   HCT 47.0 (H) 01/06/2021   MCV 89 01/06/2021   PLT 323 01/06/2021   Lab Results  Component Value Date   NA 135 01/06/2021   K 4.3 01/06/2021   CO2 22 01/06/2021   GLUCOSE 257 (H) 01/06/2021   BUN 10 01/06/2021   CREATININE 0.81 01/06/2021   BILITOT 0.5 01/06/2021   ALKPHOS 135 (H) 01/06/2021   AST 36 01/06/2021   ALT 47 (H) 01/06/2021   PROT 8.4 01/06/2021   ALBUMIN 5.2 (H) 01/06/2021   CALCIUM 10.4 (H) 01/06/2021   Lab Results  Component Value Date   CHOL 163 01/06/2021   Lab Results  Component Value Date   HDL 66 01/06/2021   Lab Results  Component Value Date   LDLCALC 80 01/06/2021   Lab Results  Component Value Date   TRIG 91 01/06/2021   Lab Results  Component Value Date   CHOLHDL 2.5 01/06/2021   Lab Results  Component Value Date   HGBA1C 10.9 (H) 01/06/2021       Assessment & Plan:   Problem List Items Addressed This Visit      Cardiovascular and Mediastinum   Hypertension associated with diabetes (HCC)   Relevant Orders   CBC (Completed)   Comprehensive metabolic panel (Completed)   TSH (Completed)   Lipid panel (Completed)   Hemoglobin A1c (Completed)   VITAMIN D 25 Hydroxy (Vit-D  Deficiency, Fractures) (Completed)   Folate (Completed)     Endocrine   Mixed diabetic hyperlipidemia associated with type 2 diabetes mellitus (HCC)   Relevant Orders   CBC (Completed)   Comprehensive metabolic panel (Completed)   TSH (Completed)   Lipid panel (Completed)   Hemoglobin A1c (Completed)   VITAMIN D  25 Hydroxy (Vit-D Deficiency, Fractures) (Completed)   Folate (Completed)   Type 2 diabetes mellitus with other specified complication (HCC)   Relevant Orders   CBC (Completed)   Comprehensive metabolic panel (Completed)   TSH (Completed)   Lipid panel (Completed)   Hemoglobin A1c (Completed)   VITAMIN D 25 Hydroxy (Vit-D Deficiency, Fractures) (Completed)   Folate (Completed)     Other   Vitamin D deficiency   Relevant Orders   CBC (Completed)   Comprehensive metabolic panel (Completed)   TSH (Completed)   Lipid panel (Completed)   Hemoglobin A1c (Completed)   VITAMIN D 25 Hydroxy (Vit-D Deficiency, Fractures) (Completed)   Folate (Completed)    Other Visit Diagnoses    Acute non-recurrent maxillary sinusitis    -  Primary   Relevant Medications   azithromycin (ZITHROMAX) 250 MG tablet   Healthcare maintenance       Relevant Orders   CBC (Completed)   Comprehensive metabolic panel (Completed)   TSH (Completed)   Lipid panel (Completed)   Hemoglobin A1c (Completed)   VITAMIN D 25 Hydroxy (Vit-D Deficiency, Fractures) (Completed)   Folate (Completed)   Fatigue, unspecified type       Relevant Orders   CBC (Completed)   Comprehensive metabolic panel (Completed)   TSH (Completed)   Lipid panel (Completed)   Hemoglobin A1c (Completed)   VITAMIN D 25 Hydroxy (Vit-D Deficiency, Fractures) (Completed)   Folate (Completed)     Acute non-recurrent maxillary sinusitis: -Symptoms have been ongoing for >7 days with minimal improvement, tenderness to palpation of maxillary sinus on exam, and has tested negative for Covid-19 so will start antibiotic therapy. If  symptoms fail to improve or worsen despite antibiotic therapy will consider short course of corticosteroid therapy. -Recommend to continue home supportive care, drink warm liquids/honey and use a humidifier.   Patient is fasting and has an upcoming chronic follow up visit so will obtain labs today.  Meds ordered this encounter  Medications  . azithromycin (ZITHROMAX) 250 MG tablet    Sig: Take 2 tabs 1st day, then take 1 tab daily until medication complete.    Dispense:  6 tablet    Refill:  0     Mayer Masker, PA-C

## 2021-01-07 LAB — LIPID PANEL
Chol/HDL Ratio: 2.5 ratio (ref 0.0–4.4)
Cholesterol, Total: 163 mg/dL (ref 100–199)
HDL: 66 mg/dL (ref >39)
LDL Chol Calc (NIH): 80 mg/dL (ref 0–99)
Triglycerides: 91 mg/dL (ref 0–149)
VLDL Cholesterol Cal: 17 mg/dL (ref 5–40)

## 2021-01-07 LAB — TSH: TSH: 2.12 u[IU]/mL (ref 0.450–4.500)

## 2021-01-07 LAB — COMPREHENSIVE METABOLIC PANEL
ALT: 47 IU/L — ABNORMAL HIGH (ref 0–32)
AST: 36 IU/L (ref 0–40)
Albumin/Globulin Ratio: 1.6 (ref 1.2–2.2)
Albumin: 5.2 g/dL — ABNORMAL HIGH (ref 3.8–4.9)
Alkaline Phosphatase: 135 IU/L — ABNORMAL HIGH (ref 44–121)
BUN/Creatinine Ratio: 12 (ref 9–23)
BUN: 10 mg/dL (ref 6–24)
Bilirubin Total: 0.5 mg/dL (ref 0.0–1.2)
CO2: 22 mmol/L (ref 20–29)
Calcium: 10.4 mg/dL — ABNORMAL HIGH (ref 8.7–10.2)
Chloride: 92 mmol/L — ABNORMAL LOW (ref 96–106)
Creatinine, Ser: 0.81 mg/dL (ref 0.57–1.00)
GFR calc Af Amer: 95 mL/min/{1.73_m2} (ref >59)
GFR calc non Af Amer: 83 mL/min/{1.73_m2} (ref >59)
Globulin, Total: 3.2 g/dL (ref 1.5–4.5)
Glucose: 257 mg/dL — ABNORMAL HIGH (ref 65–99)
Potassium: 4.3 mmol/L (ref 3.5–5.2)
Sodium: 135 mmol/L (ref 134–144)
Total Protein: 8.4 g/dL (ref 6.0–8.5)

## 2021-01-07 LAB — FOLATE: Folate: 12.6 ng/mL (ref >3.0)

## 2021-01-07 LAB — CBC
Hematocrit: 47 % — ABNORMAL HIGH (ref 34.0–46.6)
Hemoglobin: 15.6 g/dL (ref 11.1–15.9)
MCH: 29.7 pg (ref 26.6–33.0)
MCHC: 33.2 g/dL (ref 31.5–35.7)
MCV: 89 fL (ref 79–97)
Platelets: 323 10*3/uL (ref 150–450)
RBC: 5.26 x10E6/uL (ref 3.77–5.28)
RDW: 12.3 % (ref 11.7–15.4)
WBC: 10 10*3/uL (ref 3.4–10.8)

## 2021-01-07 LAB — HEMOGLOBIN A1C
Est. average glucose Bld gHb Est-mCnc: 266 mg/dL
Hgb A1c MFr Bld: 10.9 % — ABNORMAL HIGH (ref 4.8–5.6)

## 2021-01-07 LAB — VITAMIN D 25 HYDROXY (VIT D DEFICIENCY, FRACTURES): Vit D, 25-Hydroxy: 43.4 ng/mL (ref 30.0–100.0)

## 2021-01-13 ENCOUNTER — Encounter: Payer: Self-pay | Admitting: Physician Assistant

## 2021-01-13 ENCOUNTER — Ambulatory Visit: Payer: BC Managed Care – PPO | Admitting: Physician Assistant

## 2021-01-13 VITALS — Ht 63.0 in | Wt 211.0 lb

## 2021-01-13 DIAGNOSIS — E1169 Type 2 diabetes mellitus with other specified complication: Secondary | ICD-10-CM

## 2021-01-13 DIAGNOSIS — E1159 Type 2 diabetes mellitus with other circulatory complications: Secondary | ICD-10-CM

## 2021-01-13 DIAGNOSIS — E782 Mixed hyperlipidemia: Secondary | ICD-10-CM

## 2021-01-13 DIAGNOSIS — R748 Abnormal levels of other serum enzymes: Secondary | ICD-10-CM

## 2021-01-13 DIAGNOSIS — E559 Vitamin D deficiency, unspecified: Secondary | ICD-10-CM

## 2021-01-13 DIAGNOSIS — I152 Hypertension secondary to endocrine disorders: Secondary | ICD-10-CM

## 2021-01-13 MED ORDER — EMPAGLIFLOZIN 10 MG PO TABS: 10.0000 mg | ORAL_TABLET | Freq: Every day | ORAL | 0 refills | Status: DC

## 2021-01-13 MED ORDER — VITAMIN D (ERGOCALCIFEROL) 1.25 MG (50000 UNIT) PO CAPS: 50000.0000 [IU] | ORAL_CAPSULE | ORAL | 0 refills | Status: DC

## 2021-01-13 NOTE — Patient Instructions (Signed)
Diabetes Mellitus and Nutrition, Adult When you have diabetes, or diabetes mellitus, it is very important to have healthy eating habits because your blood sugar (glucose) levels are greatly affected by what you eat and drink. Eating healthy foods in the right amounts, at about the same times every day, can help you:  Control your blood glucose.  Lower your risk of heart disease.  Improve your blood pressure.  Reach or maintain a healthy weight. What can affect my meal plan? Every person with diabetes is different, and each person has different needs for a meal plan. Your health care provider may recommend that you work with a dietitian to make a meal plan that is best for you. Your meal plan may vary depending on factors such as:  The calories you need.  The medicines you take.  Your weight.  Your blood glucose, blood pressure, and cholesterol levels.  Your activity level.  Other health conditions you have, such as heart or kidney disease. How do carbohydrates affect me? Carbohydrates, also called carbs, affect your blood glucose level more than any other type of food. Eating carbs naturally raises the amount of glucose in your blood. Carb counting is a method for keeping track of how many carbs you eat. Counting carbs is important to keep your blood glucose at a healthy level, especially if you use insulin or take certain oral diabetes medicines. It is important to know how many carbs you can safely have in each meal. This is different for every person. Your dietitian can help you calculate how many carbs you should have at each meal and for each snack. How does alcohol affect me? Alcohol can cause a sudden decrease in blood glucose (hypoglycemia), especially if you use insulin or take certain oral diabetes medicines. Hypoglycemia can be a life-threatening condition. Symptoms of hypoglycemia, such as sleepiness, dizziness, and confusion, are similar to symptoms of having too much  alcohol.  Do not drink alcohol if: ? Your health care provider tells you not to drink. ? You are pregnant, may be pregnant, or are planning to become pregnant.  If you drink alcohol: ? Do not drink on an empty stomach. ? Limit how much you use to:  0-1 drink a day for women.  0-2 drinks a day for men. ? Be aware of how much alcohol is in your drink. In the [place] one drink equals one 12 oz bottle of beer (355 mL), one 5 oz glass of wine (148 mL), or one 1 oz glass of hard liquor (44 mL). ? Keep yourself hydrated with water, diet soda, or unsweetened iced tea.  Keep in mind that regular soda, juice, and other mixers may contain a lot of sugar and must be counted as carbs. What are tips for following this plan? Reading food labels  Start by checking the serving size on the "Nutrition Facts" label of packaged foods and drinks. The amount of calories, carbs, fats, and other nutrients listed on the label is based on one serving of the item. Many items contain more than one serving per package.  Check the total grams (g) of carbs in one serving. You can calculate the number of servings of carbs in one serving by dividing the total carbs by 15. For example, if a food has 30 g of total carbs per serving, it would be equal to 2 servings of carbs.  Check the number of grams (g) of saturated fats and trans fats in one serving. Choose foods that have   a low amount or none of these fats.  Check the number of milligrams (mg) of salt (sodium) in one serving. Most people should limit total sodium intake to less than 2,300 mg per day.  Always check the nutrition information of foods labeled as "low-fat" or "nonfat." These foods may be higher in added sugar or refined carbs and should be avoided.  Talk to your dietitian to identify your daily goals for nutrients listed on the label. Shopping  Avoid buying canned, pre-made, or processed foods. These foods tend to be high in fat, sodium, and added  sugar.  Shop around the outside edge of the grocery store. This is where you will most often find fresh fruits and vegetables, bulk grains, fresh meats, and fresh dairy. Cooking  Use low-heat cooking methods, such as baking, instead of high-heat cooking methods like deep frying.  Cook using healthy oils, such as olive, canola, or sunflower oil.  Avoid cooking with butter, cream, or high-fat meats. Meal planning  Eat meals and snacks regularly, preferably at the same times every day. Avoid going long periods of time without eating.  Eat foods that are high in fiber, such as fresh fruits, vegetables, beans, and whole grains. Talk with your dietitian about how many servings of carbs you can eat at each meal.  Eat 4-6 oz (112-168 g) of lean protein each day, such as lean meat, chicken, fish, eggs, or tofu. One ounce (oz) of lean protein is equal to: ? 1 oz (28 g) of meat, chicken, or fish. ? 1 egg. ?  cup (62 g) of tofu.  Eat some foods each day that contain healthy fats, such as avocado, nuts, seeds, and fish.   What foods should I eat? Fruits Berries. Apples. Oranges. Peaches. Apricots. Plums. Grapes. Mango. Papaya. Pomegranate. Kiwi. Cherries. Vegetables Lettuce. Spinach. Leafy greens, including kale, chard, collard greens, and mustard greens. Beets. Cauliflower. Cabbage. Broccoli. Carrots. Green beans. Tomatoes. Peppers. Onions. Cucumbers. Brussels sprouts. Grains Whole grains, such as whole-wheat or whole-grain bread, crackers, tortillas, cereal, and pasta. Unsweetened oatmeal. Quinoa. Brown or wild rice. Meats and other proteins Seafood. Poultry without skin. Lean cuts of poultry and beef. Tofu. Nuts. Seeds. Dairy Low-fat or fat-free dairy products such as milk, yogurt, and cheese. The items listed above may not be a complete list of foods and beverages you can eat. Contact a dietitian for more information. What foods should I avoid? Fruits Fruits canned with  syrup. Vegetables Canned vegetables. Frozen vegetables with butter or cream sauce. Grains Refined white flour and flour products such as bread, pasta, snack foods, and cereals. Avoid all processed foods. Meats and other proteins Fatty cuts of meat. Poultry with skin. Breaded or fried meats. Processed meat. Avoid saturated fats. Dairy Full-fat yogurt, cheese, or milk. Beverages Sweetened drinks, such as soda or iced tea. The items listed above may not be a complete list of foods and beverages you should avoid. Contact a dietitian for more information. Questions to ask a health care provider  Do I need to meet with a diabetes educator?  Do I need to meet with a dietitian?  What number can I call if I have questions?  When are the best times to check my blood glucose? Where to find more information:  American Diabetes Association: diabetes.org  Academy of Nutrition and Dietetics: www.eatright.org  National Institute of Diabetes and Digestive and Kidney Diseases: www.niddk.nih.gov  Association of Diabetes Care and Education Specialists: www.diabeteseducator.org Summary  It is important to have healthy eating   habits because your blood sugar (glucose) levels are greatly affected by what you eat and drink.  A healthy meal plan will help you control your blood glucose and maintain a healthy lifestyle.  Your health care provider may recommend that you work with a dietitian to make a meal plan that is best for you.  Keep in mind that carbohydrates (carbs) and alcohol have immediate effects on your blood glucose levels. It is important to count carbs and to use alcohol carefully. This information is not intended to replace advice given to you by your health care provider. Make sure you discuss any questions you have with your health care provider. Document Revised: 10/09/2019 Document Reviewed: 10/09/2019 Elsevier Patient Education  2021 [employer] Inc.  

## 2021-01-13 NOTE — Progress Notes (Signed)
Telehealth office visit note for Abigail Masker, PA-C- at [facility] Care at [facility]   I connected with current patient today by telephone and verified that I am speaking with the correct person   . Location of the patient: Home . Location of the provider: Office - This visit type was conducted due to national recommendations for restrictions regarding the COVID-19 Pandemic (e.g. social distancing) in an effort to limit this patient's exposure and mitigate transmission in our community.    - No physical exam could be performed with this format, beyond that communicated to [place] by the patient/ family members as noted.   - Additionally my office staff/ schedulers were to discuss with the patient that there may be a monetary charge related to this service, depending on their medical insurance.  My understanding is that patient understood and consented to proceed.     _________________________________________________________________________________   History of Present Illness: Positive follow-up on diabetes mellitus, hypertension and hyperlipidemia.  Has no acute concerns today.  Diabetes: Pt denies increased urination or thirst from baseline. Pt reports medication compliance. No hypoglycemic events.  Lost her glucometer so has not been checking glucose at home.  Reports has not been as diligent with watching carbohydrates and sweets and has not been active.  HTN: Pt denies chest pain, palpitations, dizziness, headache or lower extremity swelling. Taking medication as directed without side effects.  Has not been checking BP at home. Pt tries to monitor sodium and stay hydrated.  HLD: Pt taking medication as directed without issues.  Reports has increased cheese consumption.  Has reduced alcohol to 2 drinks per week since January of this year.      GAD 7 : Generalized Anxiety Score 05/10/2019  Nervous, Anxious, on Edge 0  Control/stop worrying 0  Worry too much - different things 0   Trouble relaxing 0  Restless 0  Easily annoyed or irritable 0  Afraid - awful might happen 0  Total GAD 7 Score 0  Anxiety Difficulty Not difficult at all    Depression screen [facility] 2/9 01/06/2021 08/11/2020 03/10/2020 08/14/2019 05/10/2019  Decreased Interest 0 0 0 0 0  Down, Depressed, Hopeless 0 0 0 0 0  PHQ - 2 Score 0 0 0 0 0  Altered sleeping 3 3 3 3 2   Tired, decreased energy 2 1 0 0 0  Change in appetite 0 0 0 0 0  Feeling bad or failure about yourself  0 0 0 0 0  Trouble concentrating 0 0 0 0 0  Moving slowly or fidgety/restless 0 0 0 0 0  Suicidal thoughts 0 0 0 0 0  PHQ-9 Score 5 4 3 3 2   Difficult doing work/chores - Not difficult at all Somewhat difficult Not difficult at all -      Impression and Recommendations:     1. Type 2 diabetes mellitus with other specified complication, without long-term current use of insulin (HCC)   2. Mixed diabetic hyperlipidemia associated with type 2 diabetes mellitus (HCC)   3. Hypertension associated with diabetes (HCC)   4. Vitamin D deficiency   5. Elevated liver enzymes   6. Hypercalcemia      Type 2 diabetes mellitus with other specified complication, without long-term current use of insulin: -Recent A1c has increased from 7.6-10.9.  Discussed with patient treatment adjustments and is agreeable to start SGL 2, Jardiance 10 mg. Recent GFR 83.  Advised to let me know if unable to tolerate medication. -  Encouraged to resume ambulatory glucose monitoring.  Patient declined glucometer prescription and plans to purchase one out-of-pocket which is cheaper than filing insurance. -Recommend to reduce carbohydrates and sweets and increase physical activity. -Follow-up in 3 months.  Hypertension associated with diabetes: -Patient unable to obtain blood pressure today.  Recent blood pressure in office at goal. -Continue current medication regimen. -Recent CMP: Renal function normal, potassium 4.3 and calcium 10.4 -Recommend to follow a  low-sodium diet and continue good hydration. -Will continue to monitor.  Mixed diabetic hyperlipidemia associated with type 2 diabetes mellitus: -Recent lipid panel: Total cholesterol 163, triglycerides 91, HDL 66, LDL 80 (goal<70) -Continue current medication regimen.  Recent hepatic function-AST 36, ALT 47 (mildly elevated). -Follow a diet low in saturated and transfats, advised to reduce cheese consumption. -Will continue to monitor.  Vitamin D deficiency: -Recent vitamin D 43.4 (goal >50), will provide refill of vitamin D 50,000 units.  Elevated liver enzymes: -Patient denies excessive alcohol or Tylenol use.  Is on high-dose statin so will continue to monitor liver enzymes.  If liver enzymes continue to increase will consider treatment adjustments by decreasing statin or switching to another statin such as Crestor and further evaluation with imaging studies (RUQ [place]).  Hypercalcemia: -Patient denies taking any supplements. Recent renal function and Vitamin D are within normal limits. -Will repeat CMP in 4 weeks and will place orders for PTH, magnesium, and ionized calcium for further evaluation.  Discussed recent labs.  - As part of my medical decision making, I reviewed the following data within the electronic MEDICAL RECORD NUMBER History obtained from pt /family, CMA notes reviewed and incorporated if applicable, Labs reviewed, Radiograph/ tests reviewed if applicable and OV notes from prior OV's with me, as well as any other specialists she/he has seen since seeing me last, were all reviewed and used in my medical decision making process today.    - Additionally, when appropriate, discussion had with patient regarding our treatment plan, and their biases/concerns about that plan were used in my medical decision making today.    - The patient agreed with the plan and demonstrated an understanding of the instructions.   No barriers to understanding were identified.     - The patient was  advised to call back or seek an in-person evaluation if the symptoms worsen or if the condition fails to improve as anticipated.   Return in about 3 months (around 04/15/2021) for DM- added med, HTN, HLD; lab visit in 4 weeks for CMP/PTH/Magneisum/ionized calcium .    No orders of the defined types were placed in this encounter.   Meds ordered this encounter  Medications  . empagliflozin (JARDIANCE) 10 MG TABS tablet    Sig: Take 1 tablet (10 mg total) by mouth daily before breakfast.    Dispense:  90 tablet    Refill:  0    Order Specific Question:   Supervising Provider    Answer:   Nani Gasser D [[id number]]  . Vitamin D, Ergocalciferol, (DRISDOL) 1.25 MG (50000 UNIT) CAPS capsule    Sig: Take 1 capsule (50,000 Units total) by mouth once a week.    Dispense:  12 capsule    Refill:  0    Order Specific Question:   Supervising Provider    Answer:   Nani Gasser D [[id number]]    Medications Discontinued During This Encounter  Medication Reason  . Vitamin D, Ergocalciferol, (DRISDOL) 1.25 MG (50000 UNIT) CAPS capsule Reorder       Time  spent on telephone encounter was 16 minutes.    The 21st Century Cures Act was signed into law in 2016 which includes the topic of electronic health records.  This provides immediate access to information in MyChart.  This includes consultation notes, operative notes, office notes, lab results and pathology reports.  If you have any questions about what you read please let us know at your next visit or call us at the office.  We are right here with you.  Note:  This note was prepared with assistance of Dragon voice recognition software. Occasional wrong-word or sound-a-like substitutions may have occurred due to the inherent limitations of voice recognition software.  __________________________________________________________________________________     Patient Care Team    Relationship Specialty Notifications Start End  Abigail Payne,  [place] PCP - General   03/16/20      -Vitals obtained; medications/ allergies reconciled;  personal medical, social, Sx etc.histories were updated by CMA, reviewed by me and are reflected in chart   Patient Active Problem List   Diagnosis Date Noted  . h/o Elevated alanine aminotransferase (ALT) level 02/20/2019  . Persistent microalbuminuria associated with type 2 diabetes mellitus (HCC) 01/12/2019  . Elevated liver enzymes 01/11/2019  . Vitamin D deficiency 01/11/2019  . Smoker 01/11/2019  . Excessive drinking alcohol 01/11/2019  . Mixed diabetic hyperlipidemia associated with type 2 diabetes mellitus (HCC) 12/26/2018  . Hypertension associated with diabetes (HCC) 12/26/2018  . Type 2 diabetes mellitus with other specified complication (HCC) 12/26/2018  . BMI >er 35 with chronic conditions 12/26/2018  . Noncompliance with medication regimen-patient was without insurance and unable to afford medical care or medications many months until now 12/26/2018     Current Meds  Medication Sig  . atorvastatin (LIPITOR) 80 MG tablet Take 1 tablet (80 mg total) by mouth at bedtime. **NEEDS APT FOR REFILLS**  . empagliflozin (JARDIANCE) 10 MG TABS tablet Take 1 tablet (10 mg total) by mouth daily before breakfast.  . metFORMIN (GLUCOPHAGE) 1000 MG tablet Take 1 tablet (1,000 mg total) by mouth 2 (two) times daily with a meal. **NEEDS APT FOR REFILLS**  . Olmesartan-amLODIPine-HCTZ 20-5-12.5 MG TABS Take 1 tablet by mouth daily. **NEEDS APT FOR REFILLS**  . [DISCONTINUED] Vitamin D, Ergocalciferol, (DRISDOL) 1.25 MG (50000 UNIT) CAPS capsule Take 1 capsule (50,000 Units total) by mouth once a week. **NEEDS APT FOR REFILLS**     Allergies:  No Known Allergies   ROS:  See above HPI for pertinent positives and negatives   Objective:   Height 5\' 3"  (1.6 m), weight 211 lb (95.7 kg).  (if some vitals are omitted, this means that patient was UNABLE to obtain them.) General: A & O * 3; sounds in  no acute distress; in usual state of health.  Respiratory: speaking in full sentences, no conversational dyspnea Psych: insight appears good, mood- appears full

## 2021-01-14 ENCOUNTER — Ambulatory Visit: Payer: BC Managed Care – PPO | Admitting: Dermatology

## 2021-01-14 ENCOUNTER — Encounter: Payer: Self-pay | Admitting: Dermatology

## 2021-01-14 ENCOUNTER — Other Ambulatory Visit: Payer: Self-pay

## 2021-01-14 DIAGNOSIS — Z1283 Encounter for screening for malignant neoplasm of skin: Secondary | ICD-10-CM | POA: Diagnosis not present

## 2021-01-14 DIAGNOSIS — L821 Other seborrheic keratosis: Secondary | ICD-10-CM

## 2021-01-14 DIAGNOSIS — L814 Other melanin hyperpigmentation: Secondary | ICD-10-CM

## 2021-01-25 ENCOUNTER — Encounter: Payer: Self-pay | Admitting: Dermatology

## 2021-01-25 NOTE — Progress Notes (Signed)
   New Patient   Subjective  Abigail Payne is a 55 y.o. female who presents for the following: Annual Exam (Spot on right lower lip x months- no itch no bleed). Dark spot on lower lip that appeared perhaps 1 year ago and seems to be stable. Location:  Duration:  Quality:  Associated Signs/Symptoms: Modifying Factors:  Severity:  Timing: Context: Would like other spots looked at.   The following portions of the chart were reviewed this encounter and updated as appropriate:  Tobacco  Allergies  Meds  Problems  Med Hx  Surg Hx  Fam Hx      Objective  Well appearing patient in no apparent distress; mood and affect are within normal limits. Objective  Chest - Medial [facility]): Full body exam no atypical moles, no skin cancer.  Objective  Neck - Anterior: On rounds textured 4 to 6 mm flattopped papules on right neck, lower legs,   Objective  Mid [place] Lip: 3 x 5 mm monochrome macule; dermoscopy shows no atypia.  Photographs taken pleasant dermoscopy out of focus.  Images        A full examination was performed including scalp, head, eyes, ears, nose, lips, neck, chest, axillae, abdomen, back, buttocks, bilateral upper extremities, bilateral lower extremities, hands, feet, fingers, toes, fingernails, and toenails. All findings within normal limits unless otherwise noted below.  Areas beneath undergarments not examined.   Assessment & Plan  Screening exam for skin cancer Chest - Medial [facility])  Patient encouraged to self examine her skin twice annually, option of seeing a skin specialist annually.  Seborrheic keratosis Neck - Anterior  Leave if stable.  Melanotic macule of labia Mid Lower [place] Lip  I offered to take confirmatory shave biopsy but patient content to leave this be unless there is clinical change.

## 2021-02-09 ENCOUNTER — Other Ambulatory Visit: Payer: Self-pay | Admitting: Physician Assistant

## 2021-02-09 DIAGNOSIS — I152 Hypertension secondary to endocrine disorders: Secondary | ICD-10-CM

## 2021-02-09 DIAGNOSIS — E1159 Type 2 diabetes mellitus with other circulatory complications: Secondary | ICD-10-CM

## 2021-02-09 DIAGNOSIS — E1169 Type 2 diabetes mellitus with other specified complication: Secondary | ICD-10-CM

## 2021-02-09 DIAGNOSIS — E782 Mixed hyperlipidemia: Secondary | ICD-10-CM

## 2021-02-09 DIAGNOSIS — Z0000 Encounter for general adult medical examination without abnormal findings: Secondary | ICD-10-CM

## 2021-02-09 NOTE — Progress Notes (Signed)
CMP/PTH/Magneisum/ionized calcium .

## 2021-02-12 ENCOUNTER — Other Ambulatory Visit: Payer: BC Managed Care – PPO

## 2021-02-12 ENCOUNTER — Other Ambulatory Visit: Payer: Self-pay

## 2021-02-12 DIAGNOSIS — I152 Hypertension secondary to endocrine disorders: Secondary | ICD-10-CM | POA: Diagnosis not present

## 2021-02-12 DIAGNOSIS — E559 Vitamin D deficiency, unspecified: Secondary | ICD-10-CM | POA: Diagnosis not present

## 2021-02-12 DIAGNOSIS — Z0000 Encounter for general adult medical examination without abnormal findings: Secondary | ICD-10-CM

## 2021-02-12 DIAGNOSIS — E782 Mixed hyperlipidemia: Secondary | ICD-10-CM

## 2021-02-12 DIAGNOSIS — E1169 Type 2 diabetes mellitus with other specified complication: Secondary | ICD-10-CM | POA: Diagnosis not present

## 2021-02-12 DIAGNOSIS — E1159 Type 2 diabetes mellitus with other circulatory complications: Secondary | ICD-10-CM | POA: Diagnosis not present

## 2021-02-13 LAB — COMPREHENSIVE METABOLIC PANEL
ALT: 42 IU/L — ABNORMAL HIGH (ref 0–32)
AST: 32 IU/L (ref 0–40)
Albumin/Globulin Ratio: 1.6 (ref 1.2–2.2)
Albumin: 5.1 g/dL — ABNORMAL HIGH (ref 3.8–4.9)
Alkaline Phosphatase: 116 IU/L (ref 44–121)
BUN/Creatinine Ratio: 17 (ref 9–23)
BUN: 15 mg/dL (ref 6–24)
Bilirubin Total: 0.5 mg/dL (ref 0.0–1.2)
CO2: 22 mmol/L (ref 20–29)
Calcium: 10.5 mg/dL — ABNORMAL HIGH (ref 8.7–10.2)
Chloride: 96 mmol/L (ref 96–106)
Creatinine, Ser: 0.87 mg/dL (ref 0.57–1.00)
Globulin, Total: 3.2 g/dL (ref 1.5–4.5)
Glucose: 199 mg/dL — ABNORMAL HIGH (ref 65–99)
Potassium: 4.7 mmol/L (ref 3.5–5.2)
Sodium: 138 mmol/L (ref 134–144)
Total Protein: 8.3 g/dL (ref 6.0–8.5)
eGFR: 79 mL/min/{1.73_m2} (ref >59)

## 2021-02-13 LAB — PTH, INTACT AND CALCIUM: PTH: 24 pg/mL (ref 15–65)

## 2021-02-13 LAB — MAGNESIUM: Magnesium: 2 mg/dL (ref 1.6–2.3)

## 2021-02-13 LAB — CALCIUM, IONIZED: Calcium, Ion: 5.1 mg/dL (ref 4.5–5.6)

## 2021-02-13 LAB — VITAMIN D 25 HYDROXY (VIT D DEFICIENCY, FRACTURES): Vit D, 25-Hydroxy: 51.7 ng/mL (ref 30.0–100.0)

## 2021-02-25 ENCOUNTER — Other Ambulatory Visit: Payer: Self-pay | Admitting: Physician Assistant

## 2021-02-25 DIAGNOSIS — E1159 Type 2 diabetes mellitus with other circulatory complications: Secondary | ICD-10-CM

## 2021-02-25 DIAGNOSIS — I152 Hypertension secondary to endocrine disorders: Secondary | ICD-10-CM

## 2021-02-25 DIAGNOSIS — E1169 Type 2 diabetes mellitus with other specified complication: Secondary | ICD-10-CM

## 2021-02-25 DIAGNOSIS — E782 Mixed hyperlipidemia: Secondary | ICD-10-CM

## 2021-08-03 ENCOUNTER — Other Ambulatory Visit: Payer: Self-pay

## 2021-08-03 ENCOUNTER — Other Ambulatory Visit: Payer: Self-pay | Admitting: Physician Assistant

## 2021-08-03 DIAGNOSIS — E1159 Type 2 diabetes mellitus with other circulatory complications: Secondary | ICD-10-CM

## 2021-08-03 DIAGNOSIS — I152 Hypertension secondary to endocrine disorders: Secondary | ICD-10-CM

## 2021-08-03 DIAGNOSIS — Z1231 Encounter for screening mammogram for malignant neoplasm of breast: Secondary | ICD-10-CM

## 2021-08-03 MED ORDER — OLMESARTAN-AMLODIPINE-HCTZ 20-5-12.5 MG PO TABS: 1.0000 | ORAL_TABLET | Freq: Every day | ORAL | 0 refills | Status: DC

## 2021-08-06 ENCOUNTER — Ambulatory Visit: Payer: BC Managed Care – PPO | Admitting: Physician Assistant

## 2021-08-19 ENCOUNTER — Encounter: Payer: Self-pay | Admitting: Physician Assistant

## 2021-08-19 ENCOUNTER — Other Ambulatory Visit: Payer: Self-pay

## 2021-08-19 ENCOUNTER — Ambulatory Visit (INDEPENDENT_AMBULATORY_CARE_PROVIDER_SITE_OTHER): Payer: BC Managed Care – PPO | Admitting: Physician Assistant

## 2021-08-19 VITALS — BP 134/84 | HR 93 | Temp 98.1°F | Wt 217.2 lb

## 2021-08-19 DIAGNOSIS — E782 Mixed hyperlipidemia: Secondary | ICD-10-CM | POA: Diagnosis not present

## 2021-08-19 DIAGNOSIS — E1169 Type 2 diabetes mellitus with other specified complication: Secondary | ICD-10-CM

## 2021-08-19 DIAGNOSIS — E559 Vitamin D deficiency, unspecified: Secondary | ICD-10-CM | POA: Diagnosis not present

## 2021-08-19 DIAGNOSIS — E1159 Type 2 diabetes mellitus with other circulatory complications: Secondary | ICD-10-CM | POA: Diagnosis not present

## 2021-08-19 DIAGNOSIS — Z23 Encounter for immunization: Secondary | ICD-10-CM | POA: Diagnosis not present

## 2021-08-19 DIAGNOSIS — I152 Hypertension secondary to endocrine disorders: Secondary | ICD-10-CM | POA: Diagnosis not present

## 2021-08-19 MED ORDER — ATORVASTATIN CALCIUM 80 MG PO TABS: 80.0000 mg | ORAL_TABLET | Freq: Every day | ORAL | 1 refills | Status: DC

## 2021-08-19 MED ORDER — EMPAGLIFLOZIN 25 MG PO TABS: 25.0000 mg | ORAL_TABLET | Freq: Every day | ORAL | 0 refills | Status: DC

## 2021-08-19 MED ORDER — OLMESARTAN-AMLODIPINE-HCTZ 20-5-12.5 MG PO TABS: 1.0000 | ORAL_TABLET | Freq: Every day | ORAL | 1 refills | Status: DC

## 2021-08-19 MED ORDER — METFORMIN HCL 1000 MG PO TABS: 1000.0000 mg | ORAL_TABLET | Freq: Two times a day (BID) | ORAL | 1 refills | Status: DC

## 2021-08-19 NOTE — Assessment & Plan Note (Signed)
-  Stable.  -Continue current medication regimen. -Will collect CMP for medication monitoring. 

## 2021-08-19 NOTE — Assessment & Plan Note (Signed)
-  Last Vitamin D wnl's, will repeat today. Pending lab results will send refill of Vitamin D 50,000 units or adjust treatment plan.

## 2021-08-19 NOTE — Assessment & Plan Note (Addendum)
-  Patient nonfasting, will collect direct LDL. -Recommend to take atorvastatin as directed. -Follow low fat diet.

## 2021-08-19 NOTE — Progress Notes (Signed)
Established Patient Office Visit  Subjective:  Patient ID: Abigail Payne, female    DOB: 1966-10-06  Age: 55 y.o. MRN: [id number]  CC:  Chief Complaint  Patient presents with   Diabetes   Hypertension    HPI Teren L Petrucci presents for follow up on diabetes mellitus, hypertension and hyperlipidemia.  Diabetes: Pt denies increased urination or thirst from baseline. Pt reports non-compliance with medication. No hypoglycemic events. Not checking glucose at home. Reports her diet has been poor and has been under personal stress.  HTN: Pt denies chest pain, palpitations, dizziness or lower extremity swelling. Not taking medication as directed.  Does not check BP at home. Pt follows a low salt diet.  HLD: Pt taking medication without issues. Denies side effects including myalgias and RUQ pain. Reports continues to reduce dairy intake but has not been as diligent with low fat diet and staying active.   Past Medical History:  Diagnosis Date   Anemia    Diabetes mellitus without complication ([place])    Hyperlipidemia    Hypertension     Past Surgical History:  Procedure Laterality Date   BACK SURGERY     CARPAL TUNNEL RELEASE Left    CESAREAN SECTION     Two C- Sections   KNEE SURGERY Right    MOUTH SURGERY     TONSILLECTOMY AND ADENOIDECTOMY      Family History  Problem Relation Age of Onset   Diabetes Mother    Hyperlipidemia Mother    Hypertension Mother    Heart attack Father    Diabetes Father    Hyperlipidemia Father    Hypertension Father    Colon cancer Neg Hx    Esophageal cancer Neg Hx    Rectal cancer Neg Hx    Stomach cancer Neg Hx     Social History   Socioeconomic History   Marital status: Divorced    Spouse name: Not on file   Number of children: Not on file   Years of education: Not on file   Highest education level: Not on file  Occupational History   Not on file  Tobacco Use   Smoking status: Never   Smokeless tobacco: Never    Tobacco comments:    Vape's  Vaping Use   Vaping Use: Every day  Substance and Sexual Activity   Alcohol use: Yes    Comment: 5 beers daily on an avg   Drug use: Never   Sexual activity: Yes    Birth control/protection: None  Other Topics Concern   Not on file  Social History Narrative   Not on file   Social Determinants of Health   Financial Resource Strain: Not on file  Food Insecurity: Not on file  Transportation Needs: Not on file  Physical Activity: Not on file  Stress: Not on file  Social Connections: Not on file  Intimate Partner Violence: Not on file    Outpatient Medications Prior to Visit  Medication Sig Dispense Refill   Vitamin D, Ergocalciferol, (DRISDOL) 1.25 MG (50000 UNIT) CAPS capsule Take 1 capsule (50,000 Units total) by mouth once a week. 12 capsule 0   atorvastatin (LIPITOR) 80 MG tablet Take 1 tablet (80 mg total) by mouth daily. 90 tablet 0   azithromycin (ZITHROMAX) 250 MG tablet Take 2 tabs 1st day, then take 1 tab daily until medication complete. 6 tablet 0   empagliflozin (JARDIANCE) 10 MG TABS tablet Take 1 tablet (10 mg total) by mouth daily before  breakfast. 90 tablet 0   metFORMIN (GLUCOPHAGE) 1000 MG tablet Take 1 tablet (1,000 mg total) by mouth 2 (two) times daily with a meal. 180 tablet 0   Olmesartan-amLODIPine-HCTZ 20-5-12.5 MG TABS Take 1 tablet by mouth daily. **NEEDS APT FOR REFILL** 30 tablet 0   No facility-administered medications prior to visit.    No Known Allergies  ROS Review of Systems A fourteen system review of systems was performed and found to be positive as per HPI.   Objective:    Physical Exam General:  Well Developed, well nourished, appropriate for stated age.  Neuro:  Alert and oriented,  extra-ocular muscles intact  HEENT:  Normocephalic, atraumatic, neck supple  Skin:  no gross rash, warm, pink. Cardiac:  RRR, S1 S2 Respiratory: CTA B/L and A/P, Not using accessory muscles, speaking in full sentences-  unlabored. Vascular:  Ext warm, no cyanosis apprec.; cap RF less 2 sec. No edema  Psych:  No HI/SI, judgement and insight good, Euthymic mood. Full Affect.  BP 134/84   Pulse 93   Temp 98.1 F (36.7 C)   Wt 217 lb 3.2 oz (98.5 kg)   SpO2 95%   BMI 38.48 kg/m  Wt Readings from Last 3 Encounters:  08/19/21 217 lb 3.2 oz (98.5 kg)  01/13/21 211 lb (95.7 kg)  01/06/21 211 lb 12.8 oz (96.1 kg)     Health Maintenance Due  Topic Date Due   HIV Screening  Never done   Hepatitis C Screening  Never done   Zoster Vaccines- Shingrix (1 of 2) Never done   COVID-19 Vaccine (3 - Pfizer risk series) 03/14/2020   FOOT EXAM  08/13/2020   HEMOGLOBIN A1C  07/06/2021    There are no preventive care reminders to display for this patient.  Lab Results  Component Value Date   TSH 2.120 01/06/2021   Lab Results  Component Value Date   WBC 10.0 01/06/2021   HGB 15.6 01/06/2021   HCT 47.0 (H) 01/06/2021   MCV 89 01/06/2021   PLT 323 01/06/2021   Lab Results  Component Value Date   NA 138 02/12/2021   K 4.7 02/12/2021   CO2 22 02/12/2021   GLUCOSE 199 (H) 02/12/2021   BUN 15 02/12/2021   CREATININE 0.87 02/12/2021   BILITOT 0.5 02/12/2021   ALKPHOS 116 02/12/2021   AST 32 02/12/2021   ALT 42 (H) 02/12/2021   PROT 8.3 02/12/2021   ALBUMIN 5.1 (H) 02/12/2021   CALCIUM 10.5 (H) 02/12/2021   EGFR 79 02/12/2021   Lab Results  Component Value Date   CHOL 163 01/06/2021   Lab Results  Component Value Date   HDL 66 01/06/2021   Lab Results  Component Value Date   LDLCALC 80 01/06/2021   Lab Results  Component Value Date   TRIG 91 01/06/2021   Lab Results  Component Value Date   CHOLHDL 2.5 01/06/2021   Lab Results  Component Value Date   HGBA1C 10.9 (H) 01/06/2021      Assessment & Plan:   Problem List Items Addressed This Visit       Cardiovascular and Mediastinum   Hypertension associated with diabetes ([place]) - Primary    -Stable. -Continue current  medication regimen. -Will collect CMP for medication monitoring.      Relevant Medications   atorvastatin (LIPITOR) 80 MG tablet   metFORMIN (GLUCOPHAGE) 1000 MG tablet   Olmesartan-amLODIPine-HCTZ 20-5-12.5 MG TABS   empagliflozin (JARDIANCE) 25 MG TABS tablet   Other  Relevant Orders   CBC w/Diff   Comp Met (CMET)     Endocrine   Mixed diabetic hyperlipidemia associated with type 2 diabetes mellitus ([place])    -Patient nonfasting, will collect direct LDL. -Recommend to take atorvastatin as directed. -Follow low fat diet.       Relevant Medications   atorvastatin (LIPITOR) 80 MG tablet   metFORMIN (GLUCOPHAGE) 1000 MG tablet   Olmesartan-amLODIPine-HCTZ 20-5-12.5 MG TABS   empagliflozin (JARDIANCE) 25 MG TABS tablet   Other Relevant Orders   Direct LDL   Type 2 diabetes mellitus with other specified complication ([place])    -Uncontrolled. POC A1c unable to be calculated ("too high for reading"). Discussed with patient the importance of medication compliance and low carbohydrate and glucose diet. Discussed starting insulin therapy and patient declined. Will increase Jardiance to 25 mg and recommend to continue with Metformin 2000 mg/day. Patient declined rx for glucometer kit, states will be purchase one. Discussed red flag s/s to monitor for DKA and is aware should seek immediate medical care.      Relevant Medications   atorvastatin (LIPITOR) 80 MG tablet   metFORMIN (GLUCOPHAGE) 1000 MG tablet   Olmesartan-amLODIPine-HCTZ 20-5-12.5 MG TABS   empagliflozin (JARDIANCE) 25 MG TABS tablet   Other Relevant Orders   HgB A1c     Other   Vitamin D deficiency    -Last Vitamin D wnl's, will repeat today. Pending lab results will send refill of Vitamin D 50,000 units or adjust treatment plan.      Relevant Orders   Vitamin D (25 hydroxy)   Other Visit Diagnoses     Need for influenza vaccination       Relevant Orders   Flu Vaccine QUAD 6+ mos PF IM (Fluarix Quad PF) (Completed)        Meds ordered this encounter  Medications   atorvastatin (LIPITOR) 80 MG tablet    Sig: Take 1 tablet (80 mg total) by mouth daily.    Dispense:  90 tablet    Refill:  1    Order Specific Question:   Supervising Provider    Answer:   Beatrice Lecher D [[id number]]   metFORMIN (GLUCOPHAGE) 1000 MG tablet    Sig: Take 1 tablet (1,000 mg total) by mouth 2 (two) times daily with a meal.    Dispense:  180 tablet    Refill:  1    Order Specific Question:   Supervising Provider    Answer:   Beatrice Lecher D [[id number]]   Olmesartan-amLODIPine-HCTZ 20-5-12.5 MG TABS    Sig: Take 1 tablet by mouth daily.    Dispense:  90 tablet    Refill:  1    Order Specific Question:   Supervising Provider    Answer:   Beatrice Lecher D [[id number]]   empagliflozin (JARDIANCE) 25 MG TABS tablet    Sig: Take 1 tablet (25 mg total) by mouth daily before breakfast.    Dispense:  90 tablet    Refill:  0    Order Specific Question:   Supervising Provider    Answer:   Beatrice Lecher D [[id number]]    Follow-up: Return in about 3 months (around 11/19/2021) for CPE.    Lorrene Reid, PA-C

## 2021-08-19 NOTE — Patient Instructions (Addendum)
Hyperglycemia Hyperglycemia occurs when the level of sugar (glucose) in the blood is too high. Glucose is a type of sugar that provides the body's main source of energy. Certain hormones (insulin and glucagon) control the level of glucose in the blood. Insulin lowers blood glucose, and glucagon increases blood glucose. Hyperglycemia can result from not having enough insulin in the bloodstream, or from the body not responding normally to insulin. Hyperglycemia occurs most often in people who have diabetes (diabetes mellitus), but it can happen in people who do not have diabetes. It can develop quickly, and it can be life-threatening if it causes you to become severely dehydrated (diabetic ketoacidosis or hyperglycemic hyperosmolar state). Severe hyperglycemia is a medical emergency. For most people with diabetes, a blood glucose level above 240 mg/dL is considered hyperglycemia. What are the causes? If you have diabetes, hyperglycemia may be caused by: Medicines that increase blood glucose or affect your diabetes control. Getting less physical activity. Eating more than planned. Being sick or injured, having an infection, or having surgery. Stress. Not giving yourself enough insulin (if you are taking insulin). If you have undiagnosed diabetes, this may be the reason you have hyperglycemia. If you do not have diabetes, hyperglycemia may be caused by: Certain medicines, including: Steroid medicines. Beta-blockers. Epinephrine. Thiazide diuretics. Stress. Having a serious illness, an infection, or surgery. Diseases of the pancreas. What increases the risk? Hyperglycemia is more likely to develop in people who have risk factors for diabetes, such as: Having a family member with diabetes. Certain conditions in which the body's disease-fighting system (immune system) attacks itself (autoimmune disorders). Being overweight or obese. Having an inactive (sedentary) lifestyle. Having been diagnosed  with insulin resistance. Having a history of prediabetes, gestational diabetes, or polycystic ovarian syndrome (PCOS). What are the signs or symptoms? Hyperglycemia may not cause any symptoms. If you do have symptoms, they may include: Increased thirst. Needing to urinate more often than usual. Hunger. Feeling very tired. Blurry vision. Other symptoms may develop if hyperglycemia gets worse, such as: Dry mouth. Abdominal pain. Loss of appetite. Fruity-smelling breath. Weakness. Unexpected weight loss. Tingling or numbness in the hands or feet. Headache. Cuts or bruises that are slow to heal. How is this diagnosed? Hyperglycemia is diagnosed with a blood test to measure your blood glucose level. This blood test is usually done while you are having symptoms. Your health care provider may also do a physical exam and review your medical history. You may have more tests to determine the cause of your hyperglycemia, such as: A fasting blood glucose (FBG) test. You will not be allowed to eat (you will fast) for at least 8 hours before a blood sample is taken. An A1C blood test. This provides information about blood glucose control over the previous 2-3 months. An oral glucose tolerance test (OGTT). This measures your blood glucose at two times: After fasting. This is your baseline blood glucose level. 2 hours after drinking a beverage that contains glucose. How is this treated? Treatment depends on the cause of your hyperglycemia. Treatment may include: Taking medicine to regulate your blood glucose levels. If you take insulin or other diabetes medicines, your medicine or dosage may be adjusted. Lifestyle changes, such as exercising more, eating healthier foods, or losing weight. Treating an illness or infection. Checking your blood glucose more often. Stopping or reducing steroid medicines. If your hyperglycemia becomes severe and it results in diabetic ketoacidosis or hyperglycemic  hyperosmolar state, you must be hospitalized and given IV fluids   and IV insulin. Follow these instructions at home: General instructions Take over-the-counter and prescription medicines only as told by your health care provider. Do not use any products that contain nicotine or tobacco. These products include cigarettes, chewing tobacco, and vaping devices, such as e-cigarettes. If you need help quitting, ask your health care provider. If you drink alcohol: Limit how much you have to: 0-1 drink a day for women who are not pregnant. 0-2 drinks a day for men. Know how much alcohol is in a drink. In the [place] one drink equals one 12 oz bottle of beer (355 mL), one 5 oz glass of wine (148 mL), or one 1 oz glass of hard liquor (44 mL). Learn to manage stress. If you need help with this, ask your health care provider. Do exercises as told by your health care provider. Keep all follow-up visits. This is important. Eating and drinking  Maintain a healthy weight. Stay hydrated, especially when you exercise, get sick, or spend time in hot temperatures. Drink enough fluid to keep your urine pale yellow. If you have diabetes:  Know the symptoms of hyperglycemia. Follow your diabetes management plan as told by your health care provider. Make sure you: Take your insulin and medicines as told. Follow your exercise plan. Follow your meal plan. Eat on time, and do not skip meals. Check your blood glucose as often as told. Make sure to check your blood glucose before and after exercise. If you exercise longer or in a different way, check your blood glucose more often. Follow your sick day plan whenever you cannot eat or drink normally. Make this plan in advance with your health care provider. Share your diabetes management plan with people in your workplace, school, and household. Check your urine for ketones when you are ill and as told by your health care provider. Carry a medical alert card or wear  medical alert jewelry. Where to find more information American Diabetes Association: www.diabetes.org Contact a health care provider if: Your blood glucose is at or above 240 mg/dL (13.3 mmol/L) for 2 days in a row. You have problems keeping your blood glucose in your target range. You have frequent episodes of hyperglycemia. You have signs of illness, such as nausea, vomiting, or fever. Get help right away if: Your blood glucose monitor reads "high" even when you are taking insulin. You have trouble breathing. You have a change in how you think, feel, or act (mental status). You have nausea or vomiting that does not go away. These symptoms may represent a serious problem that is an emergency. Do not wait to see if the symptoms will go away. Get medical help right away. Call your local emergency services (911 in the [place] Do not drive yourself to the hospital. Summary Hyperglycemia occurs when the level of sugar (glucose) in the blood is too high. Hyperglycemia can happen with or without diabetes, and severe hyperglycemia can be life-threatening. Hyperglycemia is diagnosed with a blood test to measure your blood glucose level. This blood test is usually done while you are having symptoms. Your health care provider may also do a physical exam and review your medical history. If you have diabetes, follow your diabetes management plan as told by your health care provider. Contact your health care provider if you have problems keeping your blood glucose in your target range. This information is not intended to replace advice given to you by your health care provider. Make sure you discuss any questions you have   with your health care provider. Document Revised: 08/15/2020 Document Reviewed: 08/15/2020 Elsevier Patient Education  2022 [employer] Inc.    Vitamin D Deficiency Vitamin D deficiency is when your body does not have enough vitamin D. Vitamin D is important to your body for many  reasons: It helps the body absorb two important minerals--calcium and phosphorus. It plays a role in bone health. It may help to prevent some diseases, such as diabetes and multiple sclerosis. It plays a role in muscle function, including heart function. If vitamin D deficiency is severe, it can cause a condition in which your bones become soft. In adults, this condition is called osteomalacia. In children, this condition is called rickets. What are the causes? This condition may be caused by: Not eating enough foods that contain vitamin D. Not getting enough natural sun exposure. Having certain digestive system diseases that make it difficult for your body to absorb vitamin D. These diseases include Crohn's disease, chronic pancreatitis, and cystic fibrosis. Having a surgery in which a part of the stomach or a part of the small intestine is removed. Having chronic kidney disease or liver disease. What increases the risk? You are more likely to develop this condition if you: Are older. Do not spend much time outdoors. Live in a long-term care facility. Have had broken bones. Have weak or thin bones (osteoporosis). Have a disease or condition that changes how the body absorbs vitamin D. Have dark skin. Take certain medicines, such as steroid medicines or certain seizure medicines. Are overweight or obese. What are the signs or symptoms? In mild cases of vitamin D deficiency, there may not be any symptoms. If the condition is severe, symptoms may include: Bone pain. Muscle pain. Falling often. Broken bones caused by a minor injury. How is this diagnosed? This condition may be diagnosed with blood tests. Imaging tests such as X-rays may also be done to look for changes in the bone. How is this treated? Treatment for this condition may depend on what caused the condition. Treatment options include: Taking vitamin D supplements. Your health care provider will suggest what dose is best  for you. Taking a calcium supplement. Your health care provider will suggest what dose is best for you. Follow these instructions at home: Eating and drinking  Eat foods that contain vitamin D. Choices include: Fortified dairy products, cereals, or juices. Fortified means that vitamin D has been added to the food. Check the label on the package to see if the food is fortified. Fatty fish, such as salmon or trout. Eggs. Oysters. Mushrooms. The items listed above may not be a complete list of recommended foods and beverages. Contact a dietitian for more information. General instructions Take medicines and supplements only as told by your health care provider. Get regular, safe exposure to natural sunlight. Do not use a tanning bed. Maintain a healthy weight. Lose weight if needed. Keep all follow-up visits as told by your health care provider. This is important. How is this prevented? You can get vitamin D by: Eating foods that naturally contain vitamin D. Eating or drinking products that have been fortified with vitamin D, such as cereals, juices, and dairy products (including milk). Taking a vitamin D supplement or a multivitamin supplement that contains vitamin D. Being in the sun. Your body naturally makes vitamin D when your skin is exposed to sunlight. Your body changes the sunlight into a form of the vitamin that it can use. Contact a health care provider if: Your symptoms  do not go away. You feel nauseous or you vomit. You have fewer bowel movements than usual or are constipated. Summary Vitamin D deficiency is when your body does not have enough vitamin D. Vitamin D is important to your body for good bone health and muscle function, and it may help prevent some diseases. Vitamin D deficiency is primarily treated through supplementation. Your health care provider will suggest what dose is best for you. You can get vitamin D by eating foods that contain vitamin D, by being in the  sun, and by taking a vitamin D supplement or a multivitamin supplement that contains vitamin D. This information is not intended to replace advice given to you by your health care provider. Make sure you discuss any questions you have with your health care provider. Document Revised: 07/10/2018 Document Reviewed: 07/10/2018 Elsevier Patient Education  2022 [employer].

## 2021-08-19 NOTE — Assessment & Plan Note (Addendum)
-  Uncontrolled. POC A1c unable to be calculated ("too high for reading"). Discussed with patient the importance of medication compliance and low carbohydrate and glucose diet. Discussed starting insulin therapy and patient declined. Will increase Jardiance to 25 mg and recommend to continue with Metformin 2000 mg/day. Patient declined rx for glucometer kit, states will be purchase one. Discussed red flag s/s to monitor for DKA and is aware should seek immediate medical care.

## 2021-08-20 LAB — COMPREHENSIVE METABOLIC PANEL
ALT: 49 IU/L — ABNORMAL HIGH (ref 0–32)
AST: 55 IU/L — ABNORMAL HIGH (ref 0–40)
Albumin/Globulin Ratio: 1.6 (ref 1.2–2.2)
Albumin: 4.7 g/dL (ref 3.8–4.9)
Alkaline Phosphatase: 125 IU/L — ABNORMAL HIGH (ref 44–121)
BUN/Creatinine Ratio: 13 (ref 9–23)
BUN: 9 mg/dL (ref 6–24)
Bilirubin Total: 0.4 mg/dL (ref 0.0–1.2)
CO2: 22 mmol/L (ref 20–29)
Calcium: 9.7 mg/dL (ref 8.7–10.2)
Chloride: 100 mmol/L (ref 96–106)
Creatinine, Ser: 0.71 mg/dL (ref 0.57–1.00)
Globulin, Total: 3 g/dL (ref 1.5–4.5)
Glucose: 310 mg/dL — ABNORMAL HIGH (ref 70–99)
Potassium: 5.1 mmol/L (ref 3.5–5.2)
Sodium: 140 mmol/L (ref 134–144)
Total Protein: 7.7 g/dL (ref 6.0–8.5)
eGFR: 101 mL/min/{1.73_m2} (ref >59)

## 2021-08-20 LAB — CBC WITH DIFFERENTIAL/PLATELET
Basophils Absolute: 0 10*3/uL (ref 0.0–0.2)
Basos: 1 %
EOS (ABSOLUTE): 0.3 10*3/uL (ref 0.0–0.4)
Eos: 6 %
Hematocrit: 43.8 % (ref 34.0–46.6)
Hemoglobin: 15 g/dL (ref 11.1–15.9)
Immature Grans (Abs): 0 10*3/uL (ref 0.0–0.1)
Immature Granulocytes: 0 %
Lymphocytes Absolute: 1.9 10*3/uL (ref 0.7–3.1)
Lymphs: 34 %
MCH: 30.5 pg (ref 26.6–33.0)
MCHC: 34.2 g/dL (ref 31.5–35.7)
MCV: 89 fL (ref 79–97)
Monocytes Absolute: 0.3 10*3/uL (ref 0.1–0.9)
Monocytes: 6 %
Neutrophils Absolute: 3.1 10*3/uL (ref 1.4–7.0)
Neutrophils: 53 %
Platelets: 297 10*3/uL (ref 150–450)
RBC: 4.92 x10E6/uL (ref 3.77–5.28)
RDW: 12.1 % (ref 11.7–15.4)
WBC: 5.7 10*3/uL (ref 3.4–10.8)

## 2021-08-20 LAB — HEMOGLOBIN A1C
Est. average glucose Bld gHb Est-mCnc: 272 mg/dL
Hgb A1c MFr Bld: 11.1 % — ABNORMAL HIGH (ref 4.8–5.6)

## 2021-08-20 LAB — LDL CHOLESTEROL, DIRECT: LDL Direct: 144 mg/dL — ABNORMAL HIGH (ref 0–99)

## 2021-08-20 LAB — VITAMIN D 25 HYDROXY (VIT D DEFICIENCY, FRACTURES): Vit D, 25-Hydroxy: 31.6 ng/mL (ref 30.0–100.0)

## 2021-09-04 ENCOUNTER — Ambulatory Visit: Payer: BC Managed Care – PPO

## 2021-10-01 ENCOUNTER — Ambulatory Visit: Payer: BC Managed Care – PPO

## 2021-10-27 ENCOUNTER — Ambulatory Visit
Admission: RE | Admit: 2021-10-27 | Discharge: 2021-10-27 | Disposition: A | Discharge status: Home / Self Care | Payer: BC Managed Care – PPO | Source: Ambulatory Visit | Attending: Physician Assistant | Admitting: Physician Assistant

## 2021-10-27 DIAGNOSIS — Z1231 Encounter for screening mammogram for malignant neoplasm of breast: Secondary | ICD-10-CM | POA: Diagnosis not present

## 2021-11-19 ENCOUNTER — Encounter: Payer: BC Managed Care – PPO | Admitting: Physician Assistant

## 2021-11-26 ENCOUNTER — Encounter: Payer: Self-pay | Admitting: Physician Assistant

## 2021-11-26 ENCOUNTER — Ambulatory Visit (INDEPENDENT_AMBULATORY_CARE_PROVIDER_SITE_OTHER): Payer: BC Managed Care – PPO | Admitting: Physician Assistant

## 2021-11-26 ENCOUNTER — Other Ambulatory Visit: Payer: Self-pay

## 2021-11-26 VITALS — BP 120/77 | HR 80 | Temp 97.3°F | Ht 63.0 in | Wt 196.0 lb

## 2021-11-26 DIAGNOSIS — I152 Hypertension secondary to endocrine disorders: Secondary | ICD-10-CM | POA: Diagnosis not present

## 2021-11-26 DIAGNOSIS — E1159 Type 2 diabetes mellitus with other circulatory complications: Secondary | ICD-10-CM

## 2021-11-26 DIAGNOSIS — Z0000 Encounter for general adult medical examination without abnormal findings: Secondary | ICD-10-CM

## 2021-11-26 DIAGNOSIS — E559 Vitamin D deficiency, unspecified: Secondary | ICD-10-CM | POA: Diagnosis not present

## 2021-11-26 DIAGNOSIS — Z1329 Encounter for screening for other suspected endocrine disorder: Secondary | ICD-10-CM

## 2021-11-26 DIAGNOSIS — E782 Mixed hyperlipidemia: Secondary | ICD-10-CM | POA: Diagnosis not present

## 2021-11-26 DIAGNOSIS — E1169 Type 2 diabetes mellitus with other specified complication: Secondary | ICD-10-CM | POA: Diagnosis not present

## 2021-11-26 DIAGNOSIS — Z23 Encounter for immunization: Secondary | ICD-10-CM | POA: Diagnosis not present

## 2021-11-26 LAB — POCT GLYCOSYLATED HEMOGLOBIN (HGB A1C): Hemoglobin A1C: 6.6 % — AB (ref 4.0–5.6)

## 2021-11-26 NOTE — Progress Notes (Signed)
Subjective:     Abigail Payne is a 56 y.o. female and is here for a comprehensive physical exam. The patient reports no problems.  Social History   Socioeconomic History   Marital status: Divorced    Spouse name: Not on file   Number of children: Not on file   Years of education: Not on file   Highest education level: Not on file  Occupational History   Not on file  Tobacco Use   Smoking status: Never   Smokeless tobacco: Never   Tobacco comments:    Vape's  Vaping Use   Vaping Use: Every day  Substance and Sexual Activity   Alcohol use: Yes    Comment: 5 beers daily on an avg   Drug use: Never   Sexual activity: Yes    Birth control/protection: None  Other Topics Concern   Not on file  Social History Narrative   Not on file   Social Determinants of Health   Financial Resource Strain: Not on file  Food Insecurity: Not on file  Transportation Needs: Not on file  Physical Activity: Not on file  Stress: Not on file  Social Connections: Not on file  Intimate Partner Violence: Not on file   Health Maintenance  Topic Date Due   Pneumococcal Vaccine 71-24 Years old (1 - PCV) Never done   HIV Screening  Never done   Hepatitis C Screening  Never done   COVID-19 Vaccine (3 - Pfizer risk series) 03/14/2020   OPHTHALMOLOGY EXAM  09/12/2021   Zoster Vaccines- Shingrix (2 of 2) 01/21/2022   HEMOGLOBIN A1C  05/26/2022   PAP SMEAR-Modifier  08/13/2022   FOOT EXAM  11/26/2022   TETANUS/TDAP  07/26/2023   MAMMOGRAM  10/28/2023   COLONOSCOPY (Pts 45-49yrs Insurance coverage will need to be confirmed)  09/20/2029   INFLUENZA VACCINE  Completed   HPV VACCINES  Aged Out    The following portions of the patient's history were reviewed and updated as appropriate: allergies, current medications, past family history, past medical history, past social history, past surgical history, and problem list.  Review of Systems Pertinent items noted in HPI and remainder of comprehensive  ROS otherwise negative.   Objective:    BP 120/77    Pulse 80    Temp (!) 97.3 F (36.3 C)    Ht 5\' 3"  (1.6 m)    Wt 196 lb (88.9 kg)    SpO2 96%    BMI 34.72 kg/m  General appearance: alert, cooperative, and no distress Head: Normocephalic, without obvious abnormality, atraumatic Eyes: conjunctivae/corneas clear. PERRL, EOM's intact. Fundi benign. Ears: normal TM's and external ear canals both ears Nose: Nares normal. Septum midline. Mucosa normal. No drainage or sinus tenderness. Throat: lips, mucosa, and tongue normal; teeth and gums normal Neck: no adenopathy, no JVD, supple, symmetrical, trachea midline, and thyroid: normal to inspection and palpation Back: symmetric, no curvature. ROM normal. No CVA tenderness. Lungs: clear to auscultation bilaterally Breasts: normal appearance, no masses or tenderness Heart: regular rate and rhythm and S1, S2 normal Abdomen: soft, non-tender; bowel sounds normal; no masses,  no organomegaly Pelvic: deferred Extremities: extremities normal, atraumatic, no cyanosis or edema Pulses: 2+ and symmetric Skin: Skin color, texture, turgor normal. No rashes or lesions Lymph nodes: Cervical, supraclavicular, and axillary nodes normal. Neurologic: Grossly normal    Assessment:    Healthy female exam.     Plan:  -Will obtain fasting labs and Vitamin D. Pending Vit D results will  send refills of Vit D once a week if indicated. -Pt agreeable to Shingrix.  -A1c has significantly improved from 11.1 to 6.6, encourage patient to continue with dietary changes and start exercise regimen. -Continue weight loss efforts with dietary changes. Discussed heart healthy diet low in fat and carbohydrates. -Recommend to stay well hydrated.  -Follow up in 4 months for DM, HTN, HLD second shingles   See After Visit Summary for Counseling Recommendations

## 2021-11-26 NOTE — Patient Instructions (Signed)

## 2021-11-27 ENCOUNTER — Other Ambulatory Visit: Payer: Self-pay | Admitting: Physician Assistant

## 2021-11-27 DIAGNOSIS — E1169 Type 2 diabetes mellitus with other specified complication: Secondary | ICD-10-CM

## 2021-11-27 LAB — CBC WITH DIFFERENTIAL/PLATELET
Basophils Absolute: 0 10*3/uL (ref 0.0–0.2)
Basos: 0 %
EOS (ABSOLUTE): 0.4 10*3/uL (ref 0.0–0.4)
Eos: 6 %
Hematocrit: 46.4 % (ref 34.0–46.6)
Hemoglobin: 15.6 g/dL (ref 11.1–15.9)
Immature Grans (Abs): 0 10*3/uL (ref 0.0–0.1)
Immature Granulocytes: 0 %
Lymphocytes Absolute: 2 10*3/uL (ref 0.7–3.1)
Lymphs: 36 %
MCH: 30.4 pg (ref 26.6–33.0)
MCHC: 33.6 g/dL (ref 31.5–35.7)
MCV: 90 fL (ref 79–97)
Monocytes Absolute: 0.4 10*3/uL (ref 0.1–0.9)
Monocytes: 7 %
Neutrophils Absolute: 2.8 10*3/uL (ref 1.4–7.0)
Neutrophils: 51 %
Platelets: 272 10*3/uL (ref 150–450)
RBC: 5.14 x10E6/uL (ref 3.77–5.28)
RDW: 13.6 % (ref 11.7–15.4)
WBC: 5.5 10*3/uL (ref 3.4–10.8)

## 2021-11-27 LAB — COMPREHENSIVE METABOLIC PANEL
ALT: 39 IU/L — ABNORMAL HIGH (ref 0–32)
AST: 31 IU/L (ref 0–40)
Albumin/Globulin Ratio: 1.7 (ref 1.2–2.2)
Albumin: 5.1 g/dL — ABNORMAL HIGH (ref 3.8–4.9)
Alkaline Phosphatase: 100 IU/L (ref 44–121)
BUN/Creatinine Ratio: 26 — ABNORMAL HIGH (ref 9–23)
BUN: 18 mg/dL (ref 6–24)
Bilirubin Total: 0.4 mg/dL (ref 0.0–1.2)
CO2: 23 mmol/L (ref 20–29)
Calcium: 10.2 mg/dL (ref 8.7–10.2)
Chloride: 98 mmol/L (ref 96–106)
Creatinine, Ser: 0.7 mg/dL (ref 0.57–1.00)
Globulin, Total: 3 g/dL (ref 1.5–4.5)
Glucose: 131 mg/dL — ABNORMAL HIGH (ref 70–99)
Potassium: 4.1 mmol/L (ref 3.5–5.2)
Sodium: 141 mmol/L (ref 134–144)
Total Protein: 8.1 g/dL (ref 6.0–8.5)
eGFR: 102 mL/min/{1.73_m2} (ref >59)

## 2021-11-27 LAB — LIPID PANEL
Chol/HDL Ratio: 2.5 ratio (ref 0.0–4.4)
Cholesterol, Total: 160 mg/dL (ref 100–199)
HDL: 63 mg/dL (ref >39)
LDL Chol Calc (NIH): 76 mg/dL (ref 0–99)
Triglycerides: 120 mg/dL (ref 0–149)
VLDL Cholesterol Cal: 21 mg/dL (ref 5–40)

## 2021-11-27 LAB — VITAMIN D 25 HYDROXY (VIT D DEFICIENCY, FRACTURES): Vit D, 25-Hydroxy: 44.1 ng/mL (ref 30.0–100.0)

## 2021-11-27 LAB — TSH: TSH: 2.19 u[IU]/mL (ref 0.450–4.500)

## 2021-12-22 ENCOUNTER — Other Ambulatory Visit: Payer: Self-pay | Admitting: Physician Assistant

## 2021-12-22 DIAGNOSIS — E1169 Type 2 diabetes mellitus with other specified complication: Secondary | ICD-10-CM

## 2022-01-22 ENCOUNTER — Other Ambulatory Visit: Payer: Self-pay | Admitting: Physician Assistant

## 2022-01-22 DIAGNOSIS — E1169 Type 2 diabetes mellitus with other specified complication: Secondary | ICD-10-CM

## 2022-02-20 ENCOUNTER — Other Ambulatory Visit: Payer: Self-pay | Admitting: Physician Assistant

## 2022-02-20 DIAGNOSIS — E1169 Type 2 diabetes mellitus with other specified complication: Secondary | ICD-10-CM

## 2022-02-23 ENCOUNTER — Other Ambulatory Visit: Payer: Self-pay | Admitting: Physician Assistant

## 2022-02-23 DIAGNOSIS — E1159 Type 2 diabetes mellitus with other circulatory complications: Secondary | ICD-10-CM

## 2022-02-23 DIAGNOSIS — E1169 Type 2 diabetes mellitus with other specified complication: Secondary | ICD-10-CM

## 2022-03-18 DIAGNOSIS — J029 Acute pharyngitis, unspecified: Secondary | ICD-10-CM | POA: Diagnosis not present

## 2022-03-22 ENCOUNTER — Other Ambulatory Visit: Payer: Self-pay | Admitting: Physician Assistant

## 2022-03-22 DIAGNOSIS — E1169 Type 2 diabetes mellitus with other specified complication: Secondary | ICD-10-CM

## 2022-03-23 NOTE — Patient Instructions (Signed)

## 2022-03-25 ENCOUNTER — Encounter: Payer: Self-pay | Admitting: Physician Assistant

## 2022-03-25 ENCOUNTER — Ambulatory Visit (INDEPENDENT_AMBULATORY_CARE_PROVIDER_SITE_OTHER): Payer: BC Managed Care – PPO | Admitting: Physician Assistant

## 2022-03-25 VITALS — BP 104/70 | HR 90 | Temp 97.7°F | Ht 63.5 in | Wt 178.0 lb

## 2022-03-25 DIAGNOSIS — E1159 Type 2 diabetes mellitus with other circulatory complications: Secondary | ICD-10-CM

## 2022-03-25 DIAGNOSIS — Z6836 Body mass index (BMI) 36.0-36.9, adult: Secondary | ICD-10-CM

## 2022-03-25 DIAGNOSIS — Z23 Encounter for immunization: Secondary | ICD-10-CM | POA: Diagnosis not present

## 2022-03-25 DIAGNOSIS — I152 Hypertension secondary to endocrine disorders: Secondary | ICD-10-CM

## 2022-03-25 DIAGNOSIS — E782 Mixed hyperlipidemia: Secondary | ICD-10-CM

## 2022-03-25 DIAGNOSIS — E1169 Type 2 diabetes mellitus with other specified complication: Secondary | ICD-10-CM | POA: Diagnosis not present

## 2022-03-25 DIAGNOSIS — E559 Vitamin D deficiency, unspecified: Secondary | ICD-10-CM

## 2022-03-25 LAB — POCT UA - MICROALBUMIN
Creatinine, POC: 50 mg/dL
Microalbumin Ur, POC: 30 mg/L

## 2022-03-25 LAB — POCT GLYCOSYLATED HEMOGLOBIN (HGB A1C): Hemoglobin A1C: 5.7 % — AB (ref 4.0–5.6)

## 2022-03-25 NOTE — Assessment & Plan Note (Signed)
-  Controlled. ?-Continue current medication regimen. ?-Discussed with patient if BP remains consistently <110/70 then will consider dose reduction of BP medication. Will collect CMP for medication monitoring.  ?-Will continue to monitor. ?

## 2022-03-25 NOTE — Progress Notes (Signed)
?Established patient visit ? ? ?Patient: Abigail Payne   DOB: [birth year]   56 y.o. Female  MRN: [id number] ?Visit Date: 03/25/2022 ? ?No chief complaint on file. ? ?Subjective  ?  ?HPI  ?Patient presents for chronic follow-up. ? ?Diabetes mellitus: Pt denies increased urination or thirst. Pt reports medication compliance. No hypoglycemic events. Does not check glucose at home. Reports has worked on her diet and reduced sweets and starches. Also has been more active with walking and going to the gym few times per week.  ? ?HTN: Pt denies chest pain, palpitations, dizziness or lower extremity swelling. Taking medication as directed without side effects. ? ?HLD: Pt taking medication as directed without issues. Denies side effects including myalgias or muscle weakness. States has reduce fat intake.   ? ? ?Medications: ?Outpatient Medications Prior to Visit  ?Medication Sig  ? atorvastatin (LIPITOR) 80 MG tablet TAKE 1 TABLET (80 MG TOTAL) BY MOUTH DAILY.  ? JARDIANCE 25 MG TABS tablet TAKE 1 TABLET (25 MG TOTAL) BY MOUTH DAILY BEFORE BREAKFAST.  ? metFORMIN (GLUCOPHAGE) 1000 MG tablet TAKE 1 TABLET BY MOUTH 2 TIMES DAILY WITH A MEAL.  ? Olmesartan-amLODIPine-HCTZ 20-5-12.5 MG TABS TAKE 1 TABLET BY MOUTH DAILY.  ? Vitamin D, Ergocalciferol, (DRISDOL) 1.25 MG (50000 UNIT) CAPS capsule Take 1 capsule (50,000 Units total) by mouth once a week.  ? ?No facility-administered medications prior to visit.  ? ? ?Review of Systems ?Review of Systems:  ?A fourteen system review of systems was performed and found to be positive as per HPI. ? ? ?  Objective  ?  ?BP 104/70   Pulse 90   Temp 97.7 ?F (36.5 ?C)   Ht 5' 3.5" (1.613 m)   Wt 178 lb (80.7 kg)   SpO2 95%   BMI 31.04 kg/m?  ?BP Readings from Last 3 Encounters:  ?03/25/22 104/70  ?11/26/21 120/77  ?08/19/21 134/84  ? ?Wt Readings from Last 3 Encounters:  ?03/25/22 178 lb (80.7 kg)  ?11/26/21 196 lb (88.9 kg)  ?08/19/21 217 lb 3.2 oz (98.5 kg)  ? ? ?Physical Exam   ?General:  Well Developed, well nourished, appropriate for stated age.  ?Neuro:  Alert and oriented,  extra-ocular muscles intact  ?HEENT:  Normocephalic, atraumatic, neck supple  ?Skin:  no gross rash, warm, pink. ?Cardiac:  RRR, S1 S2 ?Respiratory: CTA B/L  ?Vascular:  Ext warm, no cyanosis apprec.; cap RF less 2 sec. No edema.  ?Psych:  No HI/SI, judgement and insight good, Euthymic mood. Full Affect. ? ? ?Results for orders placed or performed in visit on 03/25/22  ?POCT glycosylated hemoglobin (Hb A1C)  ?Result Value Ref Range  ? Hemoglobin A1C 5.7 (A) 4.0 - 5.6 %  ? HbA1c POC (<> result, manual entry)    ? HbA1c, POC (prediabetic range)    ? HbA1c, POC (controlled diabetic range)    ?POCT UA - Microalbumin  ?Result Value Ref Range  ? Microalbumin Ur, POC 30 mg/L  ? Creatinine, POC 50 mg/dL  ? Albumin/Creatinine Ratio, Urine, POC 30-300   ? ? Assessment & Plan  ?  ? ? ?Problem List Items Addressed This Visit   ? ?  ? Cardiovascular and Mediastinum  ? Hypertension associated with diabetes ([place])  ?  -Controlled. ?-Continue current medication regimen. ?-Discussed with patient if BP remains consistently <110/70 then will consider dose reduction of BP medication. Will collect CMP for medication monitoring.  ?-Will continue to monitor. ? ?  ?  ? Relevant Orders  ?  CBC w/Diff  ? Comp Met (CMET)  ?  ? Endocrine  ? Mixed diabetic hyperlipidemia associated with type 2 diabetes mellitus ([place])  ?  -Last lipid panel wnl's, LDL 76 (goal <70). ?-Will repeat lipid panel and hepatic function today. ?-Continue current medication regimen.  ?-Continue with dietary changes and increased physical activity. ?-Will continue to monitor. ? ?  ?  ? Relevant Orders  ? Lipid Profile  ? Type 2 diabetes mellitus with other specified complication ([place]) - Primary  ?  -A1c has gradually improved and controlled at 5.7, will continue current medication regimen. Discussed with patient if A1c remains well controlled then recommend medication  adjustments (reduce Metformin) to avoid hypoglycemic events. Patient verbalized understanding. Recommend to continue with dietary and lifestyle changes. UA microalbumin, A:C 30-300 mg/g, stable. Patient is on ARB therapy. Will continue to monitor.  ? ?  ?  ? Relevant Orders  ? POCT glycosylated hemoglobin (Hb A1C) (Completed)  ? POCT UA - Microalbumin (Completed)  ?  ? Other  ? Vitamin D deficiency  ? Relevant Orders  ? Vitamin D (25 hydroxy)  ? ?Other Visit Diagnoses   ? ? Need for shingles vaccine      ? Relevant Orders  ? Varicella-zoster vaccine IM (Completed)  ? Class 2 severe obesity with serious comorbidity and body mass index (BMI) of 36.0 to 36.9 in adult, unspecified obesity type ([place])      ? ?  ? ?Class 2 severe obesity with serious comorbidity and body mass index (BMI) of 36.0 to 36.9 in adult, unspecified obesity type: ?-Current BMI 31.04. ?-Associated with hypertension, hyperlipidemia and type 2 diabetes mellitus. ?-Praised patient for 18 pound weight loss with dietary and lifestyle changes. Recommend to continue.  ? ?Return in about 4 months (around 07/26/2022) for DM, HTN, HLD.  ?   ? ? ? ?Lorrene Reid, PA-C  ?[facility] ?[phone] (phone) ?[phone] (fax) ? ?[facility] ?

## 2022-03-25 NOTE — Assessment & Plan Note (Signed)
-  Last lipid panel wnl's, LDL 76 (goal <70). ?-Will repeat lipid panel and hepatic function today. ?-Continue current medication regimen.  ?-Continue with dietary changes and increased physical activity. ?-Will continue to monitor. ?

## 2022-03-25 NOTE — Assessment & Plan Note (Addendum)
-  A1c has gradually improved and controlled at 5.7, will continue current medication regimen. Discussed with patient if A1c remains well controlled then recommend medication adjustments (reduce Metformin) to avoid hypoglycemic events. Patient verbalized understanding. Recommend to continue with dietary and lifestyle changes. UA microalbumin, A:C 30-300 mg/g, stable. Patient is on ARB therapy. Will continue to monitor.  ?

## 2022-03-26 LAB — COMPREHENSIVE METABOLIC PANEL
ALT: 39 IU/L — ABNORMAL HIGH (ref 0–32)
AST: 32 IU/L (ref 0–40)
Albumin/Globulin Ratio: 1.9 (ref 1.2–2.2)
Albumin: 5 g/dL — ABNORMAL HIGH (ref 3.8–4.9)
Alkaline Phosphatase: 109 IU/L (ref 44–121)
BUN/Creatinine Ratio: 16 (ref 9–23)
BUN: 12 mg/dL (ref 6–24)
Bilirubin Total: 0.4 mg/dL (ref 0.0–1.2)
CO2: 24 mmol/L (ref 20–29)
Calcium: 10.5 mg/dL — ABNORMAL HIGH (ref 8.7–10.2)
Chloride: 97 mmol/L (ref 96–106)
Creatinine, Ser: 0.76 mg/dL (ref 0.57–1.00)
Globulin, Total: 2.7 g/dL (ref 1.5–4.5)
Glucose: 110 mg/dL — ABNORMAL HIGH (ref 70–99)
Potassium: 4.5 mmol/L (ref 3.5–5.2)
Sodium: 140 mmol/L (ref 134–144)
Total Protein: 7.7 g/dL (ref 6.0–8.5)
eGFR: 92 mL/min/{1.73_m2} (ref >59)

## 2022-03-26 LAB — CBC WITH DIFFERENTIAL/PLATELET
Basophils Absolute: 0 10*3/uL (ref 0.0–0.2)
Basos: 0 %
EOS (ABSOLUTE): 0.4 10*3/uL (ref 0.0–0.4)
Eos: 6 %
Hematocrit: 45.1 % (ref 34.0–46.6)
Hemoglobin: 15.7 g/dL (ref 11.1–15.9)
Immature Grans (Abs): 0 10*3/uL (ref 0.0–0.1)
Immature Granulocytes: 0 %
Lymphocytes Absolute: 2.1 10*3/uL (ref 0.7–3.1)
Lymphs: 33 %
MCH: 31.5 pg (ref 26.6–33.0)
MCHC: 34.8 g/dL (ref 31.5–35.7)
MCV: 91 fL (ref 79–97)
Monocytes Absolute: 0.3 10*3/uL (ref 0.1–0.9)
Monocytes: 5 %
Neutrophils Absolute: 3.4 10*3/uL (ref 1.4–7.0)
Neutrophils: 56 %
Platelets: 287 10*3/uL (ref 150–450)
RBC: 4.98 x10E6/uL (ref 3.77–5.28)
RDW: 12.8 % (ref 11.7–15.4)
WBC: 6.2 10*3/uL (ref 3.4–10.8)

## 2022-03-26 LAB — LIPID PANEL
Chol/HDL Ratio: 2.5 ratio (ref 0.0–4.4)
Cholesterol, Total: 165 mg/dL (ref 100–199)
HDL: 67 mg/dL (ref >39)
LDL Chol Calc (NIH): 79 mg/dL (ref 0–99)
Triglycerides: 106 mg/dL (ref 0–149)
VLDL Cholesterol Cal: 19 mg/dL (ref 5–40)

## 2022-03-26 LAB — VITAMIN D 25 HYDROXY (VIT D DEFICIENCY, FRACTURES): Vit D, 25-Hydroxy: 48.3 ng/mL (ref 30.0–100.0)

## 2022-04-13 DIAGNOSIS — J019 Acute sinusitis, unspecified: Secondary | ICD-10-CM | POA: Diagnosis not present

## 2022-04-27 ENCOUNTER — Other Ambulatory Visit: Payer: Self-pay | Admitting: Physician Assistant

## 2022-04-27 DIAGNOSIS — E1169 Type 2 diabetes mellitus with other specified complication: Secondary | ICD-10-CM

## 2022-05-27 ENCOUNTER — Other Ambulatory Visit: Payer: Self-pay | Admitting: Physician Assistant

## 2022-05-27 DIAGNOSIS — E1169 Type 2 diabetes mellitus with other specified complication: Secondary | ICD-10-CM

## 2022-06-02 NOTE — Patient Instructions (Signed)
Calorie Counting for Weight Loss Calories are units of energy. Your body needs a certain number of calories from food to keep going throughout the day. When you eat or drink more calories than your body needs, your body stores the extra calories mostly as fat. When you eat or drink fewer calories than your body needs, your body burns fat to get the energy it needs. Calorie counting means keeping track of how many calories you eat and drink each day. Calorie counting can be helpful if you need to lose weight. If you eat fewer calories than your body needs, you should lose weight. Ask your health care provider what a healthy weight is for you. For calorie counting to work, you will need to eat the right number of calories each day to lose a healthy amount of weight per week. A dietitian can help you figure out how many calories you need in a day and will suggest ways to reach your calorie goal. A healthy amount of weight to lose each week is usually 1-2 lb (0.5-0.9 kg). This usually means that your daily calorie intake should be reduced by 500-750 calories. Eating 1,200-1,500 calories a day can help most women lose weight. Eating 1,500-1,800 calories a day can help most men lose weight. What do I need to know about calorie counting? Work with your health care provider or dietitian to determine how many calories you should get each day. To meet your daily calorie goal, you will need to: Find out how many calories are in each food that you would like to eat. Try to do this before you eat. Decide how much of the food you plan to eat. Keep a food log. Do this by writing down what you ate and how many calories it had. To successfully lose weight, it is important to balance calorie counting with a healthy lifestyle that includes regular activity. Where do I find calorie information?  The number of calories in a food can be found on a Nutrition Facts label. If a food does not have a Nutrition Facts label, try  to look up the calories online or ask your dietitian for help. Remember that calories are listed per serving. If you choose to have more than one serving of a food, you will have to multiply the calories per serving by the number of servings you plan to eat. For example, the label on a package of bread might say that a serving size is 1 slice and that there are 90 calories in a serving. If you eat 1 slice, you will have eaten 90 calories. If you eat 2 slices, you will have eaten 180 calories. How do I keep a food log? After each time that you eat, record the following in your food log as soon as possible: What you ate. Be sure to include toppings, sauces, and other extras on the food. How much you ate. This can be measured in cups, ounces, or number of items. How many calories were in each food and drink. The total number of calories in the food you ate. Keep your food log near you, such as in a pocket-sized notebook or on an app or website on your mobile phone. Some programs will calculate calories for you and show you how many calories you have left to meet your daily goal. What are some portion-control tips? Know how many calories are in a serving. This will help you know how many servings you can have of a certain   food. Use a measuring cup to measure serving sizes. You could also try weighing out portions on a kitchen scale. With time, you will be able to estimate serving sizes for some foods. Take time to put servings of different foods on your favorite plates or in your favorite bowls and cups so you know what a serving looks like. Try not to eat straight from a food's packaging, such as from a bag or box. Eating straight from the package makes it hard to see how much you are eating and can lead to overeating. Put the amount you would like to eat in a cup or on a plate to make sure you are eating the right portion. Use smaller plates, glasses, and bowls for smaller portions and to prevent  overeating. Try not to multitask. For example, avoid watching TV or using your computer while eating. If it is time to eat, sit down at a table and enjoy your food. This will help you recognize when you are full. It will also help you be more mindful of what and how much you are eating. What are tips for following this plan? Reading food labels Check the calorie count compared with the serving size. The serving size may be smaller than what you are used to eating. Check the source of the calories. Try to choose foods that are high in protein, fiber, and vitamins, and low in saturated fat, trans fat, and sodium. Shopping Read nutrition labels while you shop. This will help you make healthy decisions about which foods to buy. Pay attention to nutrition labels for low-fat or fat-free foods. These foods sometimes have the same number of calories or more calories than the full-fat versions. They also often have added sugar, starch, or salt to make up for flavor that was removed with the fat. Make a grocery list of lower-calorie foods and stick to it. Cooking Try to cook your favorite foods in a healthier way. For example, try baking instead of frying. Use low-fat dairy products. Meal planning Use more fruits and vegetables. One-half of your plate should be fruits and vegetables. Include lean proteins, such as chicken, turkey, and fish. Lifestyle Each week, aim to do one of the following: 150 minutes of moderate exercise, such as walking. 75 minutes of vigorous exercise, such as running. General information Know how many calories are in the foods you eat most often. This will help you calculate calorie counts faster. Find a way of tracking calories that works for you. Get creative. Try different apps or programs if writing down calories does not work for you. What foods should I eat?  Eat nutritious foods. It is better to have a nutritious, high-calorie food, such as an avocado, than a food with  few nutrients, such as a bag of potato chips. Use your calories on foods and drinks that will fill you up and will not leave you hungry soon after eating. Examples of foods that fill you up are nuts and nut butters, vegetables, lean proteins, and high-fiber foods such as whole grains. High-fiber foods are foods with more than 5 g of fiber per serving. Pay attention to calories in drinks. Low-calorie drinks include water and unsweetened drinks. The items listed above may not be a complete list of foods and beverages you can eat. Contact a dietitian for more information. What foods should I limit? Limit foods or drinks that are not good sources of vitamins, minerals, or protein or that are high in unhealthy fats. These   include: Candy. Other sweets. Sodas, specialty coffee drinks, alcohol, and juice. The items listed above may not be a complete list of foods and beverages you should avoid. Contact a dietitian for more information. How do I count calories when eating out? Pay attention to portions. Often, portions are much larger when eating out. Try these tips to keep portions smaller: Consider sharing a meal instead of getting your own. If you get your own meal, eat only half of it. Before you start eating, ask for a container and put half of your meal into it. When available, consider ordering smaller portions from the menu instead of full portions. Pay attention to your food and drink choices. Knowing the way food is cooked and what is included with the meal can help you eat fewer calories. If calories are listed on the menu, choose the lower-calorie options. Choose dishes that include vegetables, fruits, whole grains, low-fat dairy products, and lean proteins. Choose items that are boiled, broiled, grilled, or steamed. Avoid items that are buttered, battered, fried, or served with cream sauce. Items labeled as crispy are usually fried, unless stated otherwise. Choose water, low-fat milk,  unsweetened iced tea, or other drinks without added sugar. If you want an alcoholic beverage, choose a lower-calorie option, such as a glass of wine or light beer. Ask for dressings, sauces, and syrups on the side. These are usually high in calories, so you should limit the amount you eat. If you want a salad, choose a garden salad and ask for grilled meats. Avoid extra toppings such as bacon, cheese, or fried items. Ask for the dressing on the side, or ask for olive oil and vinegar or lemon to use as dressing. Estimate how many servings of a food you are given. Knowing serving sizes will help you be aware of how much food you are eating at restaurants. Where to find more information Centers for Disease Control and Prevention: www.cdc.gov U.[place]. Department of Agriculture: myplate.gov Summary Calorie counting means keeping track of how many calories you eat and drink each day. If you eat fewer calories than your body needs, you should lose weight. A healthy amount of weight to lose per week is usually 1-2 lb (0.5-0.9 kg). This usually means reducing your daily calorie intake by 500-750 calories. The number of calories in a food can be found on a Nutrition Facts label. If a food does not have a Nutrition Facts label, try to look up the calories online or ask your dietitian for help. Use smaller plates, glasses, and bowls for smaller portions and to prevent overeating. Use your calories on foods and drinks that will fill you up and not leave you hungry shortly after a meal. This information is not intended to replace advice given to you by your health care provider. Make sure you discuss any questions you have with your health care provider. Document Revised: 12/13/2019 Document Reviewed: 12/13/2019 Elsevier Patient Education  2023 Elsevier Inc.  

## 2022-06-03 ENCOUNTER — Ambulatory Visit (INDEPENDENT_AMBULATORY_CARE_PROVIDER_SITE_OTHER): Payer: BC Managed Care – PPO | Admitting: Physician Assistant

## 2022-06-03 ENCOUNTER — Encounter: Payer: Self-pay | Admitting: Physician Assistant

## 2022-06-03 VITALS — BP 96/68 | HR 90 | Temp 97.7°F | Ht 64.0 in | Wt 173.0 lb

## 2022-06-03 DIAGNOSIS — E1169 Type 2 diabetes mellitus with other specified complication: Secondary | ICD-10-CM

## 2022-06-03 MED ORDER — TIRZEPATIDE 2.5 MG/0.5ML ~~LOC~~ SOAJ: 2.5000 mg | SUBCUTANEOUS | 0 refills | Status: DC

## 2022-06-03 MED ORDER — TIRZEPATIDE 5 MG/0.5ML ~~LOC~~ SOAJ: 5.0000 mg | SUBCUTANEOUS | 0 refills | Status: DC

## 2022-06-03 NOTE — Progress Notes (Signed)
Established patient visit   Patient: Abigail Payne   DOB: [birth year]   56 y.o. Female  MRN: [id number] Visit Date: 06/03/2022  Chief Complaint  Patient presents with   Weight Check   Subjective    HPI  Patient presents for medication discussion. Patient reports interested on Mounjaro. Continues to work on controlling her sugars with monitoring carbohydrate and glucose intake and weight loss. Reports sometimes forgets to take second Metformin dose. Usually good to take Jardiance. In the past did not tolerate Ozempic.      Medications: Outpatient Medications Prior to Visit  Medication Sig   atorvastatin (LIPITOR) 80 MG tablet TAKE 1 TABLET (80 MG TOTAL) BY MOUTH DAILY.   JARDIANCE 25 MG TABS tablet TAKE 1 TABLET (25 MG TOTAL) BY MOUTH DAILY BEFORE BREAKFAST.   metFORMIN (GLUCOPHAGE) 1000 MG tablet TAKE 1 TABLET BY MOUTH 2 TIMES DAILY WITH A MEAL.   Olmesartan-amLODIPine-HCTZ 20-5-12.5 MG TABS TAKE 1 TABLET BY MOUTH DAILY.   Vitamin D, Ergocalciferol, (DRISDOL) 1.25 MG (50000 UNIT) CAPS capsule Take 1 capsule (50,000 Units total) by mouth once a week.   No facility-administered medications prior to visit.    Review of Systems Review of Systems:  A fourteen system review of systems was performed and found to be positive as per HPI.  Last CBC Lab Results  Component Value Date   WBC 6.2 03/25/2022   HGB 15.7 03/25/2022   HCT 45.1 03/25/2022   MCV 91 03/25/2022   MCH 31.5 03/25/2022   RDW 12.8 03/25/2022   PLT 287 82/95/6213   Last metabolic panel Lab Results  Component Value Date   GLUCOSE 110 (H) 03/25/2022   NA 140 03/25/2022   K 4.5 03/25/2022   CL 97 03/25/2022   CO2 24 03/25/2022   BUN 12 03/25/2022   CREATININE 0.76 03/25/2022   EGFR 92 03/25/2022   CALCIUM 10.5 (H) 03/25/2022   PROT 7.7 03/25/2022   ALBUMIN 5.0 (H) 03/25/2022   LABGLOB 2.7 03/25/2022   AGRATIO 1.9 03/25/2022   BILITOT 0.4 03/25/2022   ALKPHOS 109 03/25/2022   AST 32 03/25/2022    ALT 39 (H) 03/25/2022   Last lipids Lab Results  Component Value Date   CHOL 165 03/25/2022   HDL 67 03/25/2022   LDLCALC 79 03/25/2022   LDLDIRECT 144 (H) 08/19/2021   TRIG 106 03/25/2022   CHOLHDL 2.5 03/25/2022   Last hemoglobin A1c Lab Results  Component Value Date   HGBA1C 5.7 (A) 03/25/2022   Last thyroid functions Lab Results  Component Value Date   TSH 2.190 11/26/2021     Objective    BP 96/68   Pulse 90   Temp 97.7 F (36.5 C)   Ht $R'5\' 4"'UC$  (1.626 m)   Wt 173 lb (78.5 kg)   SpO2 96%   BMI 29.70 kg/m  BP Readings from Last 3 Encounters:  06/03/22 96/68  03/25/22 104/70  11/26/21 120/77   Wt Readings from Last 3 Encounters:  06/03/22 173 lb (78.5 kg)  03/25/22 178 lb (80.7 kg)  11/26/21 196 lb (88.9 kg)    Physical Exam  General:  Pleasant and cooperative, appropriate for stated age.  Neuro:  Alert and oriented,  extra-ocular muscles intact  HEENT:  Normocephalic, atraumatic, neck supple  Skin:  no gross rash, warm, pink. Cardiac:  RRR, S1 S2 Respiratory: CTA B/L  Vascular:  Ext warm, no cyanosis apprec.; cap RF less 2 sec. Psych:  No HI/SI, judgement and insight good, Euthymic mood.  Full Affect.   No results found for any visits on 06/03/22.  Assessment & Plan      Problem List Items Addressed This Visit       Endocrine   Type 2 diabetes mellitus with other specified complication ([place]) - Primary    -In the past did not tolerate Ozempic. Currently on Jardiance 25 mg and Metformin 1000 mg BID. Patient has cardiovascular risk factors so optimal control of diabetes mellitus will help reduce risk. A1c has gradually improved from 11.1 to 5.7. Discussed with patient reasonable to try [facility] and stop Metformin. Will continue Jaridance 25 mg. Pt verbalized understanding. Discussed potential side effects. If Mounjaro not covered then recommend to continue current medication regimen. Follow-up as scheduled for September 2023.       Relevant Medications    tirzepatide (MOUNJARO) 2.5 MG/0.5ML Pen   tirzepatide (MOUNJARO) 5 MG/0.5ML Pen     Return for as scheduled .        Lorrene Reid, PA-C  [facility] at [facility] [phone] (phone) [phone] (fax)  [place]

## 2022-06-03 NOTE — Assessment & Plan Note (Signed)
-  In the past did not tolerate Ozempic. Currently on Jardiance 25 mg and Metformin 1000 mg BID. Patient has cardiovascular risk factors so optimal control of diabetes mellitus will help reduce risk. A1c has gradually improved from 11.1 to 5.7. Discussed with patient reasonable to try [facility] and stop Metformin. Will continue Jaridance 25 mg. Pt verbalized understanding. Discussed potential side effects. If Mounjaro not covered then recommend to continue current medication regimen. Follow-up as scheduled for September 2023.

## 2022-06-22 ENCOUNTER — Telehealth: Payer: Self-pay | Admitting: Physician Assistant

## 2022-06-22 NOTE — Telephone Encounter (Signed)
[place] the beach [place] and forgot to pull off the cap to the [facility] then when realized it, it had already started streaming out the medication onto the floor. When she got home she took her next shot which was to be her last one she did it correctly but now she is one pen short. She is asking if you can just bump her up to the next dosage or what does she need to do since she didn't get but 3 of the 4 doses?

## 2022-06-25 ENCOUNTER — Other Ambulatory Visit: Payer: Self-pay | Admitting: Physician Assistant

## 2022-06-25 DIAGNOSIS — E1169 Type 2 diabetes mellitus with other specified complication: Secondary | ICD-10-CM

## 2022-08-05 ENCOUNTER — Encounter: Payer: Self-pay | Admitting: Physician Assistant

## 2022-08-05 ENCOUNTER — Ambulatory Visit (INDEPENDENT_AMBULATORY_CARE_PROVIDER_SITE_OTHER): Payer: BC Managed Care – PPO | Admitting: Physician Assistant

## 2022-08-05 VITALS — BP 96/69 | HR 80 | Temp 97.6°F | Ht 64.0 in | Wt 163.1 lb

## 2022-08-05 DIAGNOSIS — E782 Mixed hyperlipidemia: Secondary | ICD-10-CM | POA: Diagnosis not present

## 2022-08-05 DIAGNOSIS — E1159 Type 2 diabetes mellitus with other circulatory complications: Secondary | ICD-10-CM

## 2022-08-05 DIAGNOSIS — E1169 Type 2 diabetes mellitus with other specified complication: Secondary | ICD-10-CM | POA: Diagnosis not present

## 2022-08-05 DIAGNOSIS — G4700 Insomnia, unspecified: Secondary | ICD-10-CM

## 2022-08-05 DIAGNOSIS — I152 Hypertension secondary to endocrine disorders: Secondary | ICD-10-CM

## 2022-08-05 MED ORDER — EMPAGLIFLOZIN 25 MG PO TABS: ORAL_TABLET | ORAL | 1 refills | Status: DC

## 2022-08-05 MED ORDER — TIRZEPATIDE 5 MG/0.5ML ~~LOC~~ SOAJ: 5.0000 mg | SUBCUTANEOUS | 1 refills | Status: DC

## 2022-08-05 NOTE — Assessment & Plan Note (Addendum)
-  Last lipid panel: HDL 67, LDL 79 (goal<70). -Will repeat lipid panel and hepatic function today. Recommend to continue atorvastatin 80 mg daily.

## 2022-08-05 NOTE — Assessment & Plan Note (Signed)
-  Soft blood pressure today. Discussed with patient if blood pressure remains well controlled then recommend medication adjustments. Pt verbalized understanding. Will collect CMP for medication monitoring.

## 2022-08-05 NOTE — Progress Notes (Signed)
Established patient visit   Patient: Abigail Payne   DOB: 1966/03/17   56 y.o. Female  MRN: [id number] Visit Date: 08/05/2022  Chief Complaint  Patient presents with   Follow-up    Pt here for f/u on DM, HTN and HLD   Subjective    HPI HPI     Follow-up    Additional comments: Pt here for f/u on DM, HTN and HLD      Last edited by Lowella Bandy, Ken Caryl on 08/05/2022  8:05 AM.      Patient presents for chronic follow-up visit.  Diabetes: Pt denies increased urination or thirst. Pt reports medication compliance. Tolerating Mounjaro without issues. No hypoglycemic events. Not checking glucose at home. Continues with low carbohydrate and sugar diet.   HTN: Pt denies chest pain, palpitations, syncope or lower extremity swelling. Taking medication as directed without side effects.   HLD: Pt taking medication as directed without issues. No myalgias.   Insomnia: Patient reports has tried Trazodone in the past which caused nightmares. Has also tried melatonin which did not help. Has trouble falling sleep. Sleeps about 6 hrs every night. Feels restless at night and reports has always been an anxious person.   Medications: Outpatient Medications Prior to Visit  Medication Sig   alprazolam (XANAX) 2 MG tablet SMARTSIG:1 Tablet(s) By Mouth   atorvastatin (LIPITOR) 80 MG tablet TAKE 1 TABLET (80 MG TOTAL) BY MOUTH DAILY.   Olmesartan-amLODIPine-HCTZ 20-5-12.5 MG TABS TAKE 1 TABLET BY MOUTH DAILY.   [DISCONTINUED] JARDIANCE 25 MG TABS tablet TAKE 1 TABLET (25 MG TOTAL) BY MOUTH DAILY BEFORE BREAKFAST.   [DISCONTINUED] metFORMIN (GLUCOPHAGE) 1000 MG tablet TAKE 1 TABLET BY MOUTH 2 TIMES DAILY WITH A MEAL.   [DISCONTINUED] tirzepatide [facility]) 2.5 MG/0.5ML Pen Inject 2.5 mg into the skin once a week. For 4 weeks then increase to 5 mg once a week.   [DISCONTINUED] tirzepatide [facility]) 5 MG/0.5ML Pen Inject 5 mg into the skin once a week.   [DISCONTINUED] Vitamin D, Ergocalciferol,  (DRISDOL) 1.25 MG (50000 UNIT) CAPS capsule Take 1 capsule (50,000 Units total) by mouth once a week.   No facility-administered medications prior to visit.    Review of Systems Review of Systems:  A fourteen system review of systems was performed and found to be positive as per HPI.  Last CBC Lab Results  Component Value Date   WBC 6.2 03/25/2022   HGB 15.7 03/25/2022   HCT 45.1 03/25/2022   MCV 91 03/25/2022   MCH 31.5 03/25/2022   RDW 12.8 03/25/2022   PLT 287 66/04/3015   Last metabolic panel Lab Results  Component Value Date   GLUCOSE 110 (H) 03/25/2022   NA 140 03/25/2022   K 4.5 03/25/2022   CL 97 03/25/2022   CO2 24 03/25/2022   BUN 12 03/25/2022   CREATININE 0.76 03/25/2022   EGFR 92 03/25/2022   CALCIUM 10.5 (H) 03/25/2022   PROT 7.7 03/25/2022   ALBUMIN 5.0 (H) 03/25/2022   LABGLOB 2.7 03/25/2022   AGRATIO 1.9 03/25/2022   BILITOT 0.4 03/25/2022   ALKPHOS 109 03/25/2022   AST 32 03/25/2022   ALT 39 (H) 03/25/2022   Last lipids Lab Results  Component Value Date   CHOL 165 03/25/2022   HDL 67 03/25/2022   LDLCALC 79 03/25/2022   LDLDIRECT 144 (H) 08/19/2021   TRIG 106 03/25/2022   CHOLHDL 2.5 03/25/2022   Last hemoglobin A1c Lab Results  Component Value Date   HGBA1C 5.7 (A)  03/25/2022   Last thyroid functions Lab Results  Component Value Date   TSH 2.190 11/26/2021       Objective    BP 96/69   Pulse 80   Temp 97.6 F (36.4 C) (Temporal)   Ht _0  (1.626 m)   Wt 163 lb 0.8 oz (74 kg)   SpO2 96%   BMI 27.99 kg/m  BP Readings from Last 3 Encounters:  08/05/22 96/69  06/03/22 96/68  03/25/22 104/70   Wt Readings from Last 3 Encounters:  08/05/22 163 lb 0.8 oz (74 kg)  06/03/22 173 lb (78.5 kg)  03/25/22 178 lb (80.7 kg)    Physical Exam  General:  Well Developed, well nourished, appropriate for stated age.  Neuro:  Alert and oriented,  extra-ocular muscles intact  HEENT:  Normocephalic, atraumatic, neck supple  Skin:  no  gross rash, warm, pink. Cardiac:  RRR, S1 S2 Respiratory: CTA B/L w/o wheezing, crackles or rales.  Vascular:  Ext warm, no cyanosis apprec.; cap RF less 2 sec. Psych:  No HI/SI, judgement and insight good, Euthymic mood. Full Affect.   No results found for any visits on 08/05/22.  Assessment & Plan      Problem List Items Addressed This Visit       Cardiovascular and Mediastinum   Hypertension associated with diabetes ([place])    -Soft blood pressure today. Discussed with patient if blood pressure remains well controlled then recommend medication adjustments. Pt verbalized understanding. Will collect CMP for medication monitoring.      Relevant Medications   tirzepatide (MOUNJARO) 5 MG/0.5ML Pen   empagliflozin (JARDIANCE) 25 MG TABS tablet   Other Relevant Orders   CBC w/Diff   Comp Met (CMET)     Endocrine   Mixed diabetic hyperlipidemia associated with type 2 diabetes mellitus (HCC)    -Last lipid panel: HDL 67, LDL 79 (goal<70). -Will repeat lipid panel and hepatic function today. Recommend to continue atorvastatin 80 mg daily.       Relevant Medications   tirzepatide (MOUNJARO) 5 MG/0.5ML Pen   empagliflozin (JARDIANCE) 25 MG TABS tablet   Other Relevant Orders   Lipid Profile   Type 2 diabetes mellitus with other specified complication (HCC) - Primary    -Last A1c 5.7, will collect A1c. Recommend to continue Jardiance 20 mg and Mounjaro 5 mg once a week. Continue with diabetic diet.      Relevant Medications   tirzepatide (MOUNJARO) 5 MG/0.5ML Pen   empagliflozin (JARDIANCE) 25 MG TABS tablet   Other Relevant Orders   HgB A1c   Other Visit Diagnoses     Insomnia, unspecified type          Insomnia: -Patient has tried and failed Trazodone and melatonin. Recommend to trail magnesium supplement which can help with sleep and anxiety. If sleep fails to improve then we can consider alternative medication therapies.   Return in about 4 months (around 12/05/2022)  for DM, HTN, HLD.        Lorrene Reid, PA-C  [facility] at [facility] [phone] (phone) [phone] (fax)  [place]

## 2022-08-05 NOTE — Patient Instructions (Signed)

## 2022-08-05 NOTE — Assessment & Plan Note (Addendum)
-  Last A1c 5.7, will collect A1c. Recommend to continue Jardiance 25 mg and Mounjaro 5 mg once a week. Continue with diabetic diet.

## 2022-08-06 LAB — CBC WITH DIFFERENTIAL/PLATELET
Basophils Absolute: 0 10*3/uL (ref 0.0–0.2)
Basos: 0 %
EOS (ABSOLUTE): 0.3 10*3/uL (ref 0.0–0.4)
Eos: 6 %
Hematocrit: 44.6 % (ref 34.0–46.6)
Hemoglobin: 14.8 g/dL (ref 11.1–15.9)
Immature Grans (Abs): 0 10*3/uL (ref 0.0–0.1)
Immature Granulocytes: 0 %
Lymphocytes Absolute: 1.8 10*3/uL (ref 0.7–3.1)
Lymphs: 34 %
MCH: 30.2 pg (ref 26.6–33.0)
MCHC: 33.2 g/dL (ref 31.5–35.7)
MCV: 91 fL (ref 79–97)
Monocytes Absolute: 0.4 10*3/uL (ref 0.1–0.9)
Monocytes: 7 %
Neutrophils Absolute: 2.8 10*3/uL (ref 1.4–7.0)
Neutrophils: 53 %
Platelets: 301 10*3/uL (ref 150–450)
RBC: 4.9 x10E6/uL (ref 3.77–5.28)
RDW: 13 % (ref 11.7–15.4)
WBC: 5.3 10*3/uL (ref 3.4–10.8)

## 2022-08-06 LAB — LIPID PANEL
Chol/HDL Ratio: 2.4 ratio (ref 0.0–4.4)
Cholesterol, Total: 180 mg/dL (ref 100–199)
HDL: 75 mg/dL (ref >39)
LDL Chol Calc (NIH): 81 mg/dL (ref 0–99)
Triglycerides: 139 mg/dL (ref 0–149)
VLDL Cholesterol Cal: 24 mg/dL (ref 5–40)

## 2022-08-06 LAB — COMPREHENSIVE METABOLIC PANEL
ALT: 26 IU/L (ref 0–32)
AST: 25 IU/L (ref 0–40)
Albumin/Globulin Ratio: 1.6 (ref 1.2–2.2)
Albumin: 5 g/dL — ABNORMAL HIGH (ref 3.8–4.9)
Alkaline Phosphatase: 97 IU/L (ref 44–121)
BUN/Creatinine Ratio: 17 (ref 9–23)
BUN: 13 mg/dL (ref 6–24)
Bilirubin Total: 0.4 mg/dL (ref 0.0–1.2)
CO2: 24 mmol/L (ref 20–29)
Calcium: 10.4 mg/dL — ABNORMAL HIGH (ref 8.7–10.2)
Chloride: 99 mmol/L (ref 96–106)
Creatinine, Ser: 0.78 mg/dL (ref 0.57–1.00)
Globulin, Total: 3.1 g/dL (ref 1.5–4.5)
Glucose: 88 mg/dL (ref 70–99)
Potassium: 4.6 mmol/L (ref 3.5–5.2)
Sodium: 139 mmol/L (ref 134–144)
Total Protein: 8.1 g/dL (ref 6.0–8.5)
eGFR: 90 mL/min/{1.73_m2} (ref >59)

## 2022-08-06 LAB — HEMOGLOBIN A1C
Est. average glucose Bld gHb Est-mCnc: 120 mg/dL
Hgb A1c MFr Bld: 5.8 % — ABNORMAL HIGH (ref 4.8–5.6)

## 2022-09-15 IMAGING — MG DIGITAL SCREENING BREAST BILAT IMPLANT W/ TOMO W/ CAD
8 of 12 series · 8 of 28 positions shown · non-contrast
Comparison: Previous exam(s).

CLINICAL DATA: Screening.

EXAM:
DIGITAL SCREENING BILATERAL MAMMOGRAM WITH IMPLANTS, CAD AND
TOMOSYNTHESIS
TECHNIQUE: Bilateral screening digital craniocaudal and mediolateral oblique
mammograms were obtained. Bilateral screening digital breast
tomosynthesis was performed. The images were evaluated with
computer-aided detection. Standard and/or implant displaced views
were performed.

[L CC]
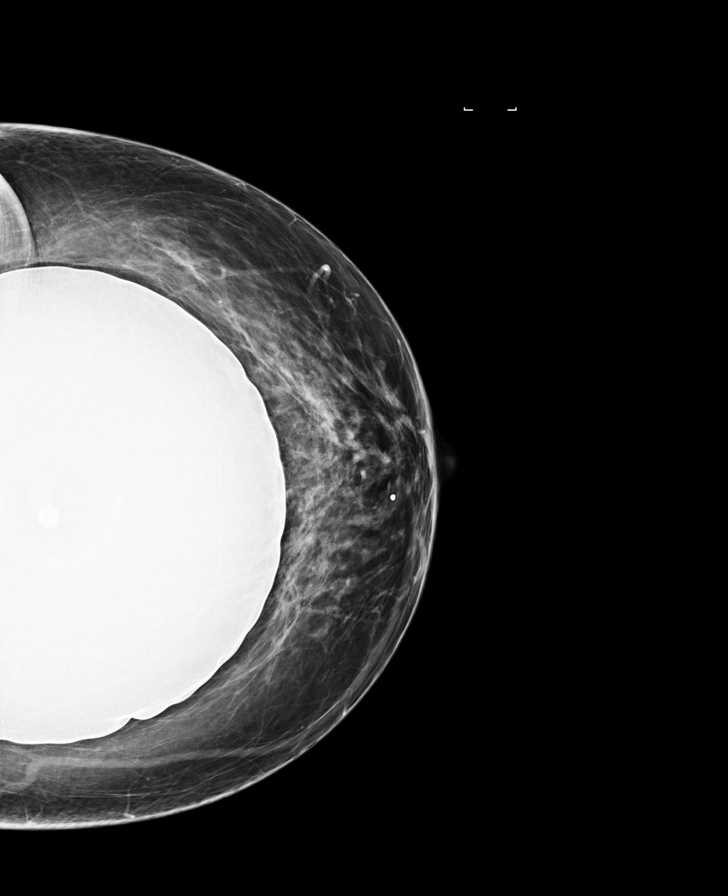

[R CC]
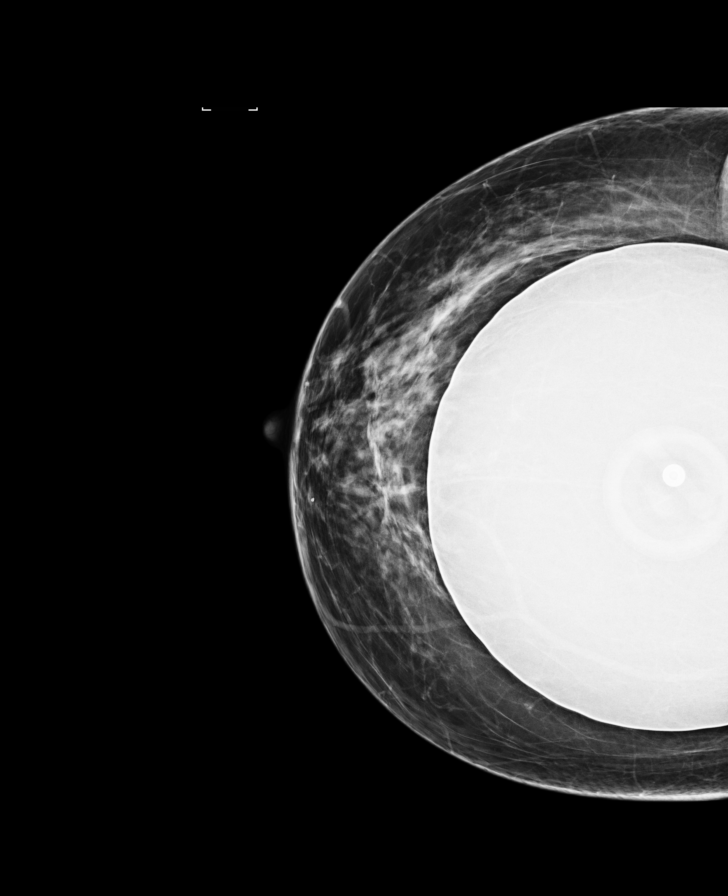

[L MLO]
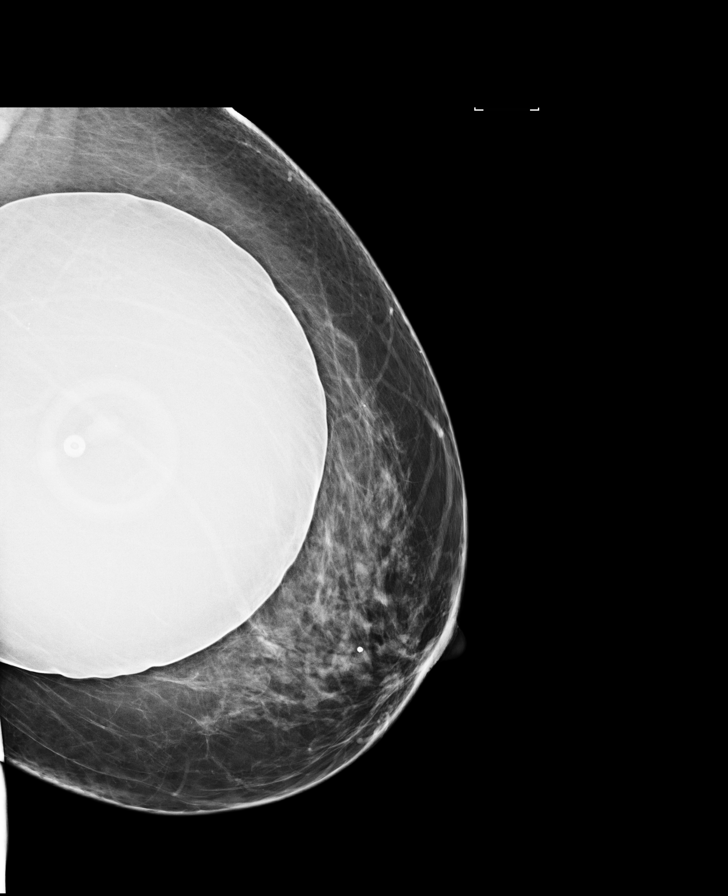

[R MLO]
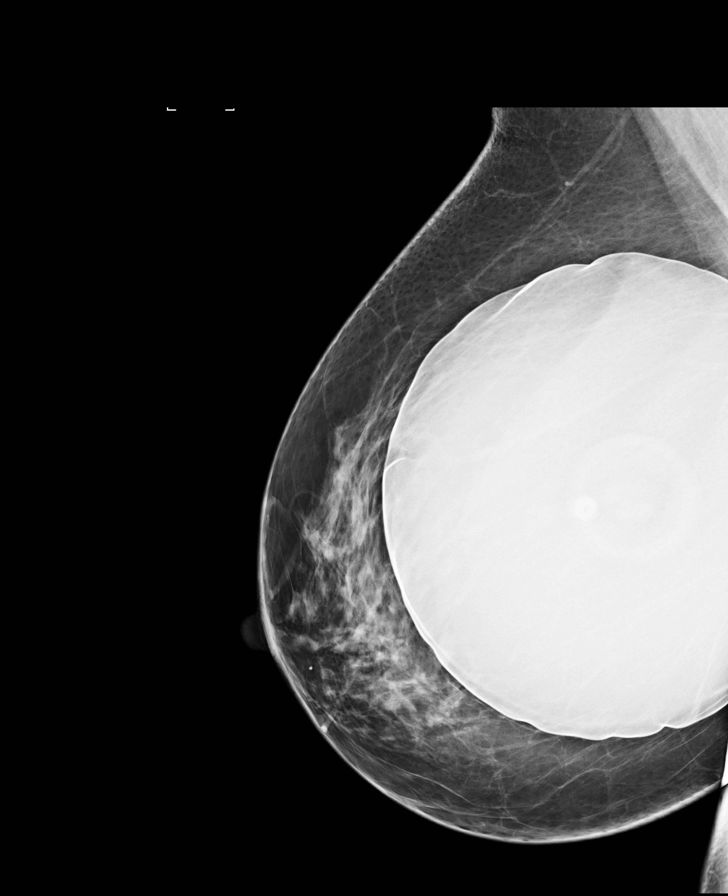

[R MLO synth-2D]
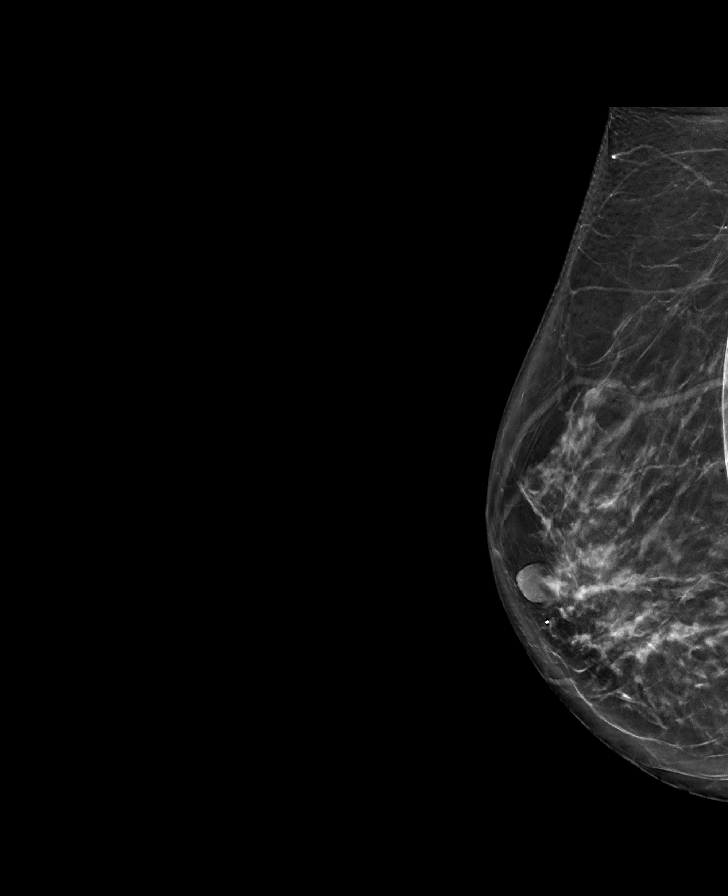

[L CC synth-2D]
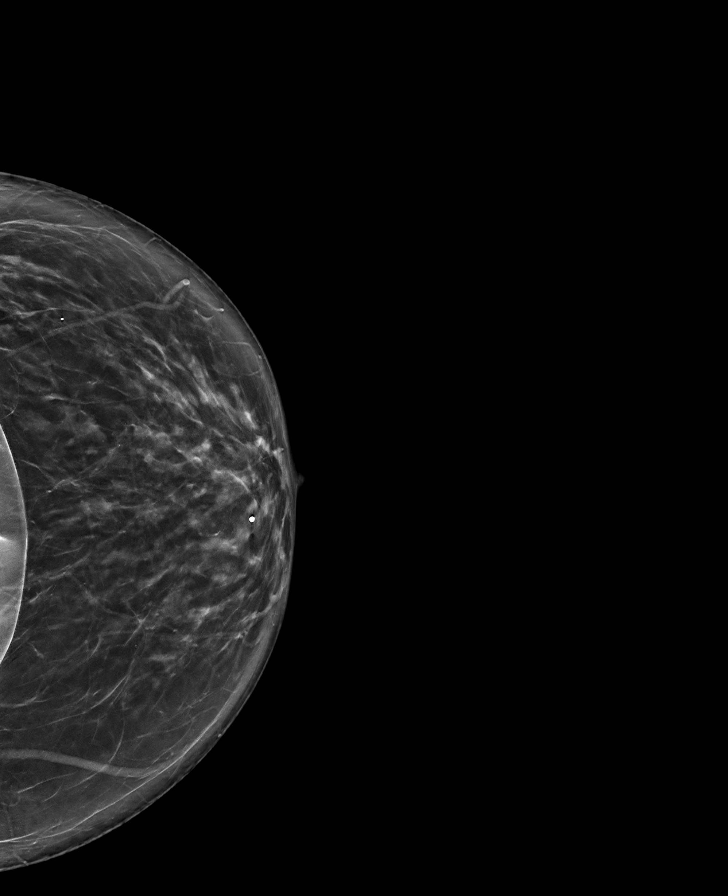

[R CC synth-2D]
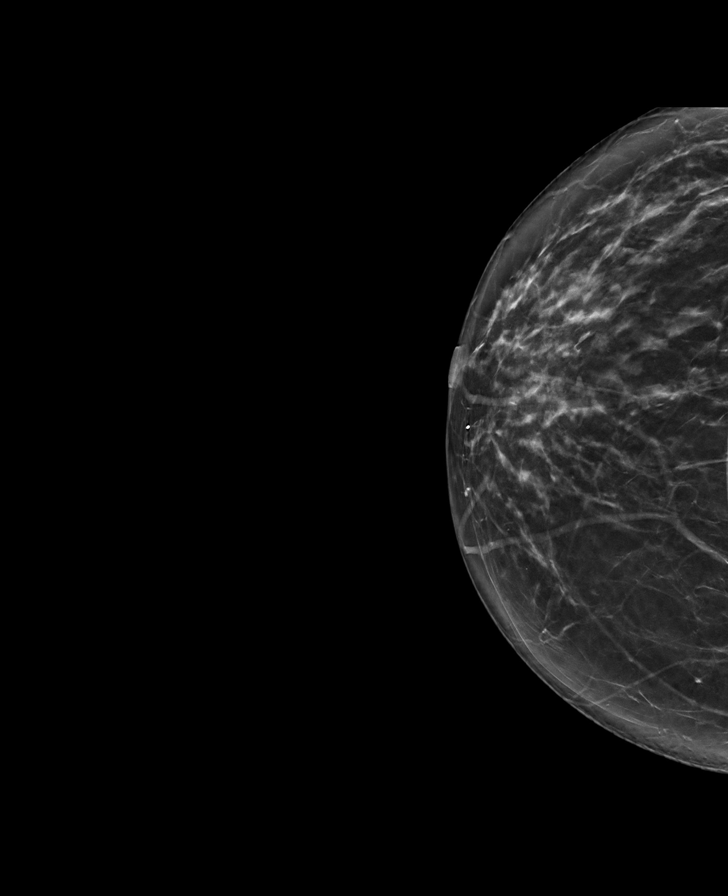

[L MLO synth-2D]
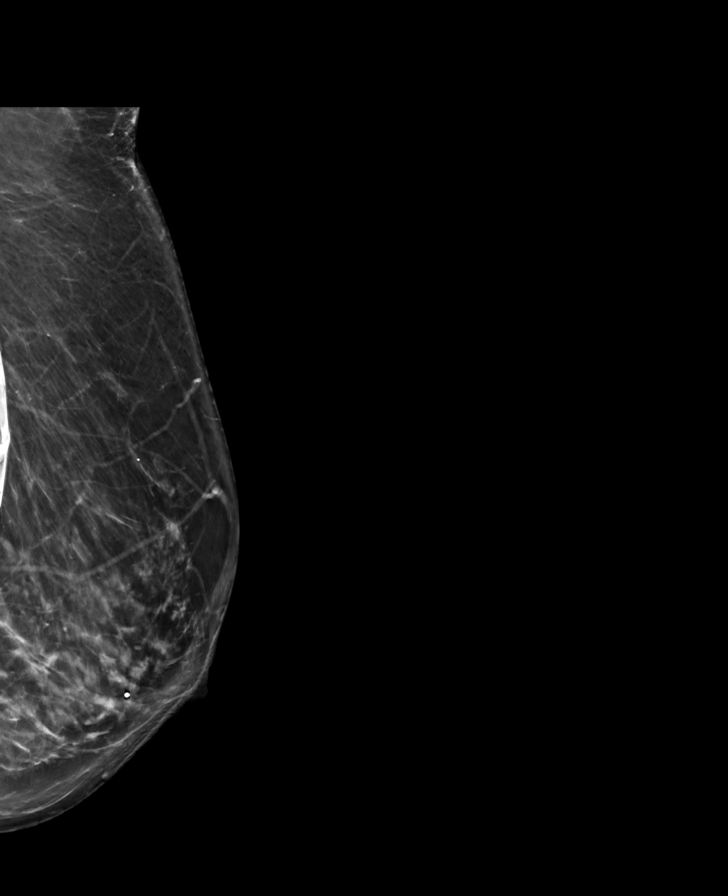

[8 of 28 positions shown; findings below may reference images not displayed]

ACR Breast Density Category b: There are scattered areas of
fibroglandular density.
FINDINGS: The patient has implants. There are no findings suspicious for
malignancy.
IMPRESSION: No mammographic evidence of malignancy. A result letter of this
screening mammogram will be mailed directly to the patient.

RECOMMENDATION:
Screening mammogram in one year. (Code:[id number])

BI-RADS CATEGORY  1:  Negative.

## 2022-09-16 ENCOUNTER — Other Ambulatory Visit: Payer: Self-pay | Admitting: Physician Assistant

## 2022-09-16 DIAGNOSIS — Z1231 Encounter for screening mammogram for malignant neoplasm of breast: Secondary | ICD-10-CM

## 2022-10-04 ENCOUNTER — Other Ambulatory Visit: Payer: Self-pay | Admitting: Physician Assistant

## 2022-10-04 DIAGNOSIS — E782 Mixed hyperlipidemia: Secondary | ICD-10-CM

## 2022-10-14 ENCOUNTER — Other Ambulatory Visit: Payer: Self-pay | Admitting: Nurse Practitioner

## 2022-10-14 DIAGNOSIS — I152 Hypertension secondary to endocrine disorders: Secondary | ICD-10-CM

## 2022-11-16 ENCOUNTER — Ambulatory Visit
Admission: RE | Admit: 2022-11-16 | Discharge: 2022-11-16 | Disposition: A | Discharge status: Home / Self Care | Payer: BC Managed Care – PPO | Source: Ambulatory Visit | Attending: Physician Assistant | Admitting: Physician Assistant

## 2022-11-16 DIAGNOSIS — Z1231 Encounter for screening mammogram for malignant neoplasm of breast: Secondary | ICD-10-CM

## 2022-12-06 NOTE — Progress Notes (Signed)
Established patient visit   Patient: Abigail Payne   DOB: December 09, 1965   57 y.o. Female  MRN: [id number] Visit Date: 12/07/2022   Chief Complaint  Patient presents with   Follow-up   Diabetes   Subjective    HPI  Follow up  -type 2 diabetes  -HgbA1c 5.7 today  --last check was 5.8 08/05/2022  --due for eye exam - goes to eye Doctor  on Tuesday next week.  -hypertension  --generally well controlled  -will schedule CPE with pap for next visit.    Medications: Outpatient Medications Prior to Visit  Medication Sig   atorvastatin (LIPITOR) 80 MG tablet TAKE 1 TABLET (80 MG TOTAL) BY MOUTH DAILY.   empagliflozin (JARDIANCE) 25 MG TABS tablet TAKE 1 TABLET (25 MG TOTAL) BY MOUTH DAILY BEFORE BREAKFAST.   tirzepatide (MOUNJARO) 5 MG/0.5ML Pen Inject 5 mg into the skin once a week.   [DISCONTINUED] Olmesartan-amLODIPine-HCTZ 20-5-12.5 MG TABS TAKE 1 TABLET BY MOUTH DAILY.   [DISCONTINUED] alprazolam (XANAX) 2 MG tablet SMARTSIG:1 Tablet(s) By Mouth (Patient not taking: Reported on 12/07/2022)   No facility-administered medications prior to visit.    Review of Systems  Constitutional:  Negative for activity change, appetite change, chills, fatigue and fever.  HENT:  Negative for congestion, postnasal drip, rhinorrhea, sinus pressure, sinus pain, sneezing and sore throat.   Eyes: Negative.   Respiratory:  Negative for cough, chest tightness, shortness of breath and wheezing.   Cardiovascular:  Negative for chest pain and palpitations.  Gastrointestinal:  Negative for abdominal pain, constipation, diarrhea, nausea and vomiting.  Endocrine: Negative for cold intolerance, heat intolerance, polydipsia and polyuria.       Blood sugars doing well    Genitourinary:  Negative for dyspareunia, dysuria, flank pain, frequency and urgency.  Musculoskeletal:  Negative for arthralgias, back pain and myalgias.  Skin:  Negative for rash.  Allergic/Immunologic: Negative for environmental  allergies.  Neurological:  Negative for dizziness, weakness and headaches.  Hematological:  Negative for adenopathy.  Psychiatric/Behavioral:  The patient is not nervous/anxious.     Last CBC Lab Results  Component Value Date   WBC 5.3 08/05/2022   HGB 14.8 08/05/2022   HCT 44.6 08/05/2022   MCV 91 08/05/2022   MCH 30.2 08/05/2022   RDW 13.0 08/05/2022   PLT 301 [id number]   Last metabolic panel Lab Results  Component Value Date   GLUCOSE 88 08/05/2022   NA 139 08/05/2022   K 4.6 08/05/2022   CL 99 08/05/2022   CO2 24 08/05/2022   BUN 13 08/05/2022   CREATININE 0.78 08/05/2022   EGFR 90 08/05/2022   CALCIUM 10.4 (H) 08/05/2022   PROT 8.1 08/05/2022   ALBUMIN 5.0 (H) 08/05/2022   LABGLOB 3.1 08/05/2022   AGRATIO 1.6 08/05/2022   BILITOT 0.4 08/05/2022   ALKPHOS 97 08/05/2022   AST 25 08/05/2022   ALT 26 08/05/2022   Last lipids Lab Results  Component Value Date   CHOL 180 08/05/2022   HDL 75 08/05/2022   LDLCALC 81 08/05/2022   LDLDIRECT 144 (H) 08/19/2021   TRIG 139 08/05/2022   CHOLHDL 2.4 08/05/2022   Last hemoglobin A1c Lab Results  Component Value Date   HGBA1C 5.7 12/07/2022   Last thyroid functions Lab Results  Component Value Date   TSH 2.190 11/26/2021   Last vitamin D Lab Results  Component Value Date   VD25OH 48.3 03/25/2022       Objective     Today's Vitals  12/07/22 0944  BP: 109/70  Pulse: 85  SpO2: 99%  Weight: 168 lb (76.2 kg)  Height: '5\' 4"'$  (1.626 m)   Body mass index is 28.84 kg/m.  BP Readings from Last 3 Encounters:  12/07/22 109/70  08/05/22 96/69  06/03/22 96/68    Wt Readings from Last 3 Encounters:  12/07/22 168 lb (76.2 kg)  08/05/22 163 lb 0.8 oz (74 kg)  06/03/22 173 lb (78.5 kg)    Physical Exam Vitals and nursing note reviewed.  Constitutional:      Appearance: Normal appearance. She is well-developed.  HENT:     Head: Normocephalic and atraumatic.     Nose: Nose normal.     Mouth/Throat:      Mouth: Mucous membranes are moist.     Pharynx: Oropharynx is clear.  Eyes:     Extraocular Movements: Extraocular movements intact.     Conjunctiva/sclera: Conjunctivae normal.     Pupils: Pupils are equal, round, and reactive to light.  Cardiovascular:     Rate and Rhythm: Normal rate and regular rhythm.     Pulses: Normal pulses.     Heart sounds: Normal heart sounds.  Pulmonary:     Effort: Pulmonary effort is normal.     Breath sounds: Normal breath sounds.  Abdominal:     Palpations: Abdomen is soft.  Musculoskeletal:        General: Normal range of motion.     Cervical back: Normal range of motion and neck supple.  Lymphadenopathy:     Cervical: No cervical adenopathy.  Skin:    General: Skin is warm and dry.     Capillary Refill: Capillary refill takes less than 2 seconds.  Neurological:     General: No focal deficit present.     Mental Status: She is alert and oriented to person, place, and time.  Psychiatric:        Mood and Affect: Mood normal.        Behavior: Behavior normal.        Thought Content: Thought content normal.        Judgment: Judgment normal.     Results for orders placed or performed in visit on 12/07/22  POCT glycosylated hemoglobin (Hb A1C)  Result Value Ref Range   Hemoglobin A1C     HbA1c POC (<> result, manual entry) 5.7 4.0 - 5.6 %   HbA1c, POC (prediabetic range)     HbA1c, POC (controlled diabetic range)      Assessment & Plan    1. Type 2 diabetes mellitus with other specified complication, without long-term current use of insulin (HCC) HgbA1c 5.7 today. Continue diabetic medication as prescribed.  - POCT glycosylated hemoglobin (Hb A1C)  2. Hypertension associated with diabetes ([place]) Stable. Continue diabetic medication as prescribed  - POCT glycosylated hemoglobin (Hb A1C)  3. Mixed diabetic hyperlipidemia associated with type 2 diabetes mellitus (HCC) Continue lipitor as prescribed  - POCT glycosylated hemoglobin (Hb  A1C)  4. Insomnia due to anxiety and fear May take alprazolam 0.5 mg if needed for anxiety/insomnia.    Problem List Items Addressed This Visit       Cardiovascular and Mediastinum   Hypertension associated with diabetes ([place])   Relevant Orders   POCT glycosylated hemoglobin (Hb A1C) (Completed)     Endocrine   Mixed diabetic hyperlipidemia associated with type 2 diabetes mellitus (HCC)   Relevant Orders   POCT glycosylated hemoglobin (Hb A1C) (Completed)   Type 2 diabetes mellitus with other  specified complication ([place]) - Primary   Relevant Orders   POCT glycosylated hemoglobin (Hb A1C) (Completed)     Other   Insomnia due to anxiety and fear     Return in about 4 months (around 04/07/2023) for health maintenance exam, check HgbA1c - 45 min. appointment -she can stay with me .         Ronnell Freshwater, NP  [facility] [phone] (phone) [phone] (fax)  [place]

## 2022-12-07 ENCOUNTER — Ambulatory Visit: Payer: BC Managed Care – PPO | Admitting: Nurse Practitioner

## 2022-12-07 ENCOUNTER — Encounter: Payer: Self-pay | Admitting: Nurse Practitioner

## 2022-12-07 VITALS — BP 109/70 | HR 85 | Ht 64.0 in | Wt 168.0 lb

## 2022-12-07 DIAGNOSIS — I152 Hypertension secondary to endocrine disorders: Secondary | ICD-10-CM | POA: Diagnosis not present

## 2022-12-07 DIAGNOSIS — E1159 Type 2 diabetes mellitus with other circulatory complications: Secondary | ICD-10-CM

## 2022-12-07 DIAGNOSIS — E782 Mixed hyperlipidemia: Secondary | ICD-10-CM

## 2022-12-07 DIAGNOSIS — E1169 Type 2 diabetes mellitus with other specified complication: Secondary | ICD-10-CM

## 2022-12-07 DIAGNOSIS — F5105 Insomnia due to other mental disorder: Secondary | ICD-10-CM

## 2022-12-07 DIAGNOSIS — F409 Phobic anxiety disorder, unspecified: Secondary | ICD-10-CM

## 2022-12-07 LAB — POCT GLYCOSYLATED HEMOGLOBIN (HGB A1C): HbA1c POC (<> result, manual entry): 5.7 % (ref 4.0–5.6)

## 2022-12-07 MED ORDER — ALPRAZOLAM 0.5 MG PO TABS: 0.5000 mg | ORAL_TABLET | Freq: Two times a day (BID) | ORAL | 0 refills | Status: DC | PRN

## 2022-12-08 ENCOUNTER — Other Ambulatory Visit: Payer: Self-pay | Admitting: Nurse Practitioner

## 2022-12-08 DIAGNOSIS — I152 Hypertension secondary to endocrine disorders: Secondary | ICD-10-CM

## 2022-12-14 ENCOUNTER — Encounter: Payer: Self-pay | Admitting: Nurse Practitioner

## 2022-12-14 DIAGNOSIS — E119 Type 2 diabetes mellitus without complications: Secondary | ICD-10-CM | POA: Diagnosis not present

## 2022-12-14 LAB — HM DIABETES EYE EXAM

## 2022-12-22 ENCOUNTER — Other Ambulatory Visit: Payer: Self-pay | Admitting: Nurse Practitioner

## 2022-12-22 DIAGNOSIS — F409 Phobic anxiety disorder, unspecified: Secondary | ICD-10-CM

## 2023-01-07 DIAGNOSIS — F409 Phobic anxiety disorder, unspecified: Secondary | ICD-10-CM | POA: Insufficient documentation

## 2023-01-10 ENCOUNTER — Other Ambulatory Visit: Payer: Self-pay | Admitting: Nurse Practitioner

## 2023-01-10 DIAGNOSIS — F5105 Insomnia due to other mental disorder: Secondary | ICD-10-CM

## 2023-01-10 DIAGNOSIS — E1169 Type 2 diabetes mellitus with other specified complication: Secondary | ICD-10-CM

## 2023-01-10 DIAGNOSIS — I152 Hypertension secondary to endocrine disorders: Secondary | ICD-10-CM

## 2023-01-10 NOTE — Telephone Encounter (Signed)
L.O.V: 12/07/22  N.O.V: 06/21/23  L.R.F: 10/05/22 Atorvastatin 90 tab 0 refill   12/09/22 Olmesartan-Amlodipine-HCTZ 30 tab 0 refill  12/22/22 Alprazolam 20 tab 0 refill  Atorvastatin and Olmesartan has been sent to pharmacy.

## 2023-03-02 ENCOUNTER — Other Ambulatory Visit: Payer: Self-pay | Admitting: Nurse Practitioner

## 2023-03-02 DIAGNOSIS — E1169 Type 2 diabetes mellitus with other specified complication: Secondary | ICD-10-CM

## 2023-03-07 ENCOUNTER — Other Ambulatory Visit: Payer: Self-pay | Admitting: Nurse Practitioner

## 2023-03-07 DIAGNOSIS — F409 Phobic anxiety disorder, unspecified: Secondary | ICD-10-CM

## 2023-03-31 ENCOUNTER — Other Ambulatory Visit: Payer: Self-pay | Admitting: Nurse Practitioner

## 2023-03-31 DIAGNOSIS — E1169 Type 2 diabetes mellitus with other specified complication: Secondary | ICD-10-CM

## 2023-04-06 ENCOUNTER — Other Ambulatory Visit: Payer: Self-pay | Admitting: Nurse Practitioner

## 2023-04-06 DIAGNOSIS — E1169 Type 2 diabetes mellitus with other specified complication: Secondary | ICD-10-CM

## 2023-04-21 ENCOUNTER — Other Ambulatory Visit: Payer: Self-pay | Admitting: Nurse Practitioner

## 2023-04-21 DIAGNOSIS — E1169 Type 2 diabetes mellitus with other specified complication: Secondary | ICD-10-CM

## 2023-05-17 ENCOUNTER — Telehealth: Payer: Self-pay | Admitting: *Deleted

## 2023-05-17 NOTE — Telephone Encounter (Signed)
LVM to call office to get appt rescheduled due to providers last day being 05/13/23.

## 2023-05-18 ENCOUNTER — Other Ambulatory Visit: Payer: Self-pay | Admitting: Nurse Practitioner

## 2023-05-18 DIAGNOSIS — F409 Phobic anxiety disorder, unspecified: Secondary | ICD-10-CM

## 2023-06-04 DIAGNOSIS — Z1159 Encounter for screening for other viral diseases: Secondary | ICD-10-CM | POA: Diagnosis not present

## 2023-06-04 DIAGNOSIS — R509 Fever, unspecified: Secondary | ICD-10-CM | POA: Diagnosis not present

## 2023-06-04 DIAGNOSIS — Z20822 Contact with and (suspected) exposure to covid-19: Secondary | ICD-10-CM | POA: Diagnosis not present

## 2023-06-04 DIAGNOSIS — R6883 Chills (without fever): Secondary | ICD-10-CM | POA: Diagnosis not present

## 2023-06-04 DIAGNOSIS — Z20828 Contact with and (suspected) exposure to other viral communicable diseases: Secondary | ICD-10-CM | POA: Diagnosis not present

## 2023-06-04 DIAGNOSIS — K529 Noninfective gastroenteritis and colitis, unspecified: Secondary | ICD-10-CM | POA: Diagnosis not present

## 2023-06-04 DIAGNOSIS — R5081 Fever presenting with conditions classified elsewhere: Secondary | ICD-10-CM | POA: Diagnosis not present

## 2023-06-04 DIAGNOSIS — R197 Diarrhea, unspecified: Secondary | ICD-10-CM | POA: Diagnosis not present

## 2023-06-06 ENCOUNTER — Telehealth: Payer: Self-pay | Admitting: Nurse Practitioner

## 2023-06-06 ENCOUNTER — Emergency Department (HOSPITAL_COMMUNITY)
Admission: EM | Admit: 2023-06-06 | Discharge: 2023-06-06 | Disposition: A | Discharge status: Home / Self Care | Payer: BC Managed Care – PPO | Attending: Emergency Medicine | Admitting: Emergency Medicine

## 2023-06-06 ENCOUNTER — Encounter (HOSPITAL_COMMUNITY): Payer: Self-pay

## 2023-06-06 ENCOUNTER — Emergency Department (HOSPITAL_COMMUNITY): Payer: BC Managed Care – PPO

## 2023-06-06 DIAGNOSIS — K529 Noninfective gastroenteritis and colitis, unspecified: Secondary | ICD-10-CM | POA: Diagnosis not present

## 2023-06-06 DIAGNOSIS — I10 Essential (primary) hypertension: Secondary | ICD-10-CM | POA: Diagnosis not present

## 2023-06-06 DIAGNOSIS — Z79899 Other long term (current) drug therapy: Secondary | ICD-10-CM | POA: Insufficient documentation

## 2023-06-06 DIAGNOSIS — E119 Type 2 diabetes mellitus without complications: Secondary | ICD-10-CM | POA: Insufficient documentation

## 2023-06-06 DIAGNOSIS — R109 Unspecified abdominal pain: Secondary | ICD-10-CM | POA: Diagnosis not present

## 2023-06-06 DIAGNOSIS — K5289 Other specified noninfective gastroenteritis and colitis: Secondary | ICD-10-CM | POA: Diagnosis not present

## 2023-06-06 LAB — COMPREHENSIVE METABOLIC PANEL
ALT: 20 U/L (ref 0–44)
AST: 26 U/L (ref 15–41)
Albumin: 3.5 g/dL (ref 3.5–5.0)
Alkaline Phosphatase: 70 U/L (ref 38–126)
Anion gap: 13 (ref 5–15)
BUN: 6 mg/dL (ref 6–20)
CO2: 22 mmol/L (ref 22–32)
Calcium: 8.5 mg/dL — ABNORMAL LOW (ref 8.9–10.3)
Chloride: 99 mmol/L (ref 98–111)
Creatinine, Ser: 0.74 mg/dL (ref 0.44–1.00)
GFR, Estimated: >60 mL/min (ref >60)
Glucose, Bld: 85 mg/dL (ref 70–99)
Potassium: 3.4 mmol/L — ABNORMAL LOW (ref 3.5–5.1)
Sodium: 134 mmol/L — ABNORMAL LOW (ref 135–145)
Total Bilirubin: 1.5 mg/dL — ABNORMAL HIGH (ref 0.3–1.2)
Total Protein: 7 g/dL (ref 6.5–8.1)

## 2023-06-06 LAB — CBC WITH DIFFERENTIAL/PLATELET
Abs Immature Granulocytes: 0.03 10*3/uL (ref 0.00–0.07)
Basophils Absolute: 0 10*3/uL (ref 0.0–0.1)
Basophils Relative: 0 %
Eosinophils Absolute: 0.3 10*3/uL (ref 0.0–0.5)
Eosinophils Relative: 4 %
HCT: 38.3 % (ref 36.0–46.0)
Hemoglobin: 13 g/dL (ref 12.0–15.0)
Immature Granulocytes: 0 %
Lymphocytes Relative: 20 %
Lymphs Abs: 1.5 10*3/uL (ref 0.7–4.0)
MCH: 31.3 pg (ref 26.0–34.0)
MCHC: 33.9 g/dL (ref 30.0–36.0)
MCV: 92.3 fL (ref 80.0–100.0)
Monocytes Absolute: 0.7 10*3/uL (ref 0.1–1.0)
Monocytes Relative: 10 %
Neutro Abs: 4.9 10*3/uL (ref 1.7–7.7)
Neutrophils Relative %: 66 %
Platelets: 256 10*3/uL (ref 150–400)
RBC: 4.15 MIL/uL (ref 3.87–5.11)
RDW: 13.2 % (ref 11.5–15.5)
WBC: 7.5 10*3/uL (ref 4.0–10.5)
nRBC: 0 % (ref 0.0–0.2)

## 2023-06-06 LAB — LACTIC ACID, PLASMA: Lactic Acid, Venous: 0.9 mmol/L (ref 0.5–1.9)

## 2023-06-06 LAB — LIPASE, BLOOD: Lipase: 40 U/L (ref 11–51)

## 2023-06-06 LAB — C DIFFICILE QUICK SCREEN W PCR REFLEX
C Diff antigen: NEGATIVE
C Diff interpretation: NOT DETECTED
C Diff toxin: NEGATIVE

## 2023-06-06 MED ORDER — LACTATED RINGERS IV BOLUS
1000.0000 mL | Freq: Once | INTRAVENOUS | Status: AC
  Administered 2023-06-06: 1000 mL via INTRAVENOUS

## 2023-06-06 MED ORDER — METRONIDAZOLE 500 MG PO TABS: 500.0000 mg | ORAL_TABLET | Freq: Two times a day (BID) | ORAL | 0 refills | Status: DC

## 2023-06-06 MED ORDER — CIPROFLOXACIN HCL 500 MG PO TABS: 500.0000 mg | ORAL_TABLET | Freq: Two times a day (BID) | ORAL | 0 refills | Status: AC

## 2023-06-06 MED ORDER — IOHEXOL 300 MG/ML  SOLN
100.0000 mL | Freq: Once | INTRAMUSCULAR | Status: AC | PRN
  Administered 2023-06-06: 100 mL via INTRAVENOUS

## 2023-06-06 MED ORDER — DICYCLOMINE HCL 20 MG PO TABS: 20.0000 mg | ORAL_TABLET | Freq: Three times a day (TID) | ORAL | 0 refills | Status: DC

## 2023-06-06 NOTE — ED Provider Notes (Signed)
[place] EMERGENCY DEPARTMENT AT [facility] Provider Note   CSN: [id number] Arrival date & time: 06/06/23  1010     History  Chief Complaint  Patient presents with   Abdominal Pain    Abigail Payne is a 57 y.o. female.  HPI 57 year old female with history of hypertension, hyperlipidemia, diabetes presents with diarrhea.  Symptoms originally started about 5 days ago.  [place] the beach [place].  2 days ago she started having pink in her diarrhea.  She states it is not a big amount.  She had fevers at first but that has gone away.  Had headaches as well but she thinks that was related to the fever and/or dehydration.  She has on and off abdominal pain, primarily a cramping abdominal pain.  Symptoms are primarily going on at night where she will have multiple diarrheal bowel movements.  During the day she will only have 1 or 2 and the pains also better.  2 days ago she went to urgent care and had a negative COVID and flu test.  Was told to come to the ER for a CT scan if not improving in the next 48 hours.  Continues to have symptoms and had significant diarrhea last night and pain.  Right now there is no significant pain. No blood thinner use.  Home Medications Prior to Admission medications   Medication Sig Start Date End Date Taking? Authorizing Provider  ALPRAZolam (XANAX) 0.5 MG tablet TAKE 1 TABLET BY MOUTH 2 TIMES DAILY AS NEEDED FOR ANXIETY. 05/18/23   Melida Quitter, PA  atorvastatin (LIPITOR) 80 MG tablet TAKE 1 TABLET (80 MG TOTAL) BY MOUTH DAILY. 01/10/23   Carlean Jews, NP  ciprofloxacin (CIPRO) 500 MG tablet Take 1 tablet (500 mg total) by mouth every 12 (twelve) hours for 7 days. 06/06/23 06/13/23 Yes Pricilla Loveless, MD  dicyclomine (BENTYL) 20 MG tablet Take 1 tablet (20 mg total) by mouth 3 (three) times daily before meals. 06/06/23  Yes Pricilla Loveless, MD  empagliflozin (JARDIANCE) 25 MG TABS tablet TAKE 1 TABLET (25 MG TOTAL) BY MOUTH DAILY  BEFORE BREAKFAST. 03/31/23   Carlean Jews, NP  metroNIDAZOLE (FLAGYL) 500 MG tablet Take 1 tablet (500 mg total) by mouth 2 (two) times daily. 06/06/23  Yes Pricilla Loveless, MD  MOUNJARO 5 MG/0.5ML Pen INJECT 5 MG INTO THE SKIN ONCE A WEEK. 04/21/23   Boscia, Heather E, NP  Olmesartan-amLODIPine-HCTZ 20-5-12.5 MG TABS TAKE 1 TABLET BY MOUTH DAILY. 01/10/23   Carlean Jews, NP      Allergies    Patient has no known allergies.    Review of Systems   Review of Systems  Constitutional:  Positive for fever.  Gastrointestinal:  Positive for abdominal pain, blood in stool, diarrhea and vomiting (1 time).    Physical Exam Updated Vital Signs BP 104/69 (BP Location: Left Arm)   Pulse 85   Temp 98.7 F (37.1 C) (Oral)   Resp 19   SpO2 96%  Physical Exam Vitals and nursing note reviewed.  Constitutional:      General: She is not in acute distress.    Appearance: She is well-developed. She is not ill-appearing or diaphoretic.  HENT:     Head: Normocephalic and atraumatic.  Cardiovascular:     Rate and Rhythm: Normal rate and regular rhythm.     Heart sounds: Normal heart sounds.  Pulmonary:     Effort: Pulmonary effort is normal.  Breath sounds: Normal breath sounds.  Abdominal:     Palpations: Abdomen is soft.     Tenderness: There is abdominal tenderness in the right upper quadrant and right lower quadrant.  Skin:    General: Skin is warm and dry.  Neurological:     Mental Status: She is alert.     ED Results / Procedures / Treatments   Labs (all labs ordered are listed, but only abnormal results are displayed) Labs Reviewed  COMPREHENSIVE METABOLIC PANEL - Abnormal; Notable for the following components:      Result Value   Sodium 134 (*)    Potassium 3.4 (*)    Calcium 8.5 (*)    Total Bilirubin 1.5 (*)    All other components within normal limits  GASTROINTESTINAL PANEL BY PCR, STOOL (REPLACES STOOL CULTURE)  C DIFFICILE QUICK SCREEN W PCR REFLEX    LIPASE,  BLOOD  CBC WITH DIFFERENTIAL/PLATELET  LACTIC ACID, PLASMA    EKG None  Radiology CT ABDOMEN PELVIS W CONTRAST  Result Date: 06/06/2023 CLINICAL DATA:  colitis EXAM: CT ABDOMEN AND PELVIS WITH CONTRAST TECHNIQUE: Multidetector CT imaging of the abdomen and pelvis was performed using the standard protocol following bolus administration of intravenous contrast. RADIATION DOSE REDUCTION: This exam was performed according to the departmental dose-optimization program which includes automated exposure control, adjustment of the mA and/or kV according to patient size and/or use of iterative reconstruction technique. CONTRAST:  OMNIPAQUE IOHEXOL 300 MG/ML  SOLN COMPARISON:  None Available. FINDINGS: Lower chest: Lung bases are clear. Hepatobiliary: No focal liver abnormality is seen. No gallstones, gallbladder wall thickening, or biliary dilatation. Pancreas: Unremarkable. No pancreatic ductal dilatation or surrounding inflammatory changes. Spleen: Normal in size without focal abnormality. Adrenals/Urinary Tract: Adrenal glands are unremarkable. Kidneys are normal, without renal calculi, focal lesion, or hydronephrosis. Bladder is unremarkable. Stomach/Bowel: Stomach is within normal limits. Appendix appears normal. No evidence of bowel obstruction. There is wall thickening in the sigmoid colon and the rectum with surrounding inflammatory mesenteric stranding. There is trace free fluid in the pelvis, likely reactive. Vascular/Lymphatic: No significant vascular findings are present. There are few prominent lymph nodes in the right lower quadrant, likely reactive. Reproductive: Uterus and bilateral adnexa are unremarkable. Other: No abdominal wall hernia or abnormality. No abdominopelvic ascites. Musculoskeletal: No acute or significant osseous findings. IMPRESSION: Wall thickening in the sigmoid colon and rectum with surrounding inflammatory mesenteric stranding. Findings worrisome for proctocolitis, which  may be infectious or inflammatory in nature. Electronically Signed   By: Lorenza Cambridge M.D.   On: 06/06/2023 13:35    Procedures Procedures    Medications Ordered in ED Medications  lactated ringers bolus 1,000 mL (0 mLs Intravenous Stopped 06/06/23 1235)  iohexol (OMNIPAQUE) 300 MG/ML solution 100 mL (100 mLs Intravenous Contrast Given 06/06/23 1243)    ED Course/ Medical Decision Making/ A&P                             Medical Decision Making Amount and/or Complexity of Data Reviewed Labs: ordered.    Details: Normal WBC and hemoglobin. Radiology: ordered and independent interpretation performed.    Details: Colitis  Risk Prescription drug management.   Patient presents with the above symptoms for several days.  She currently declines anything for pain and her abdominal pain is not too bad at this moment.  She was able to give [place] a stool sample and stool studies are pending.  CT shows  proctocolitis.  Discussed with GI, Willette Cluster.  She has reviewed the chart and discussed with her attending.  Recommend 7 days Cipro and Flagyl as well as Bentyl.  They will follow-up with the patient over the phone in the next day or 2.  Otherwise the C. difficile is pending but GI states they can follow-up on that.  The patient has normal vitals here and no indication for admission.  Her pain is well-controlled and she has tenderness on exam, no peritonitis.  Will discharge home with return precautions.        Final Clinical Impression(s) / ED Diagnoses Final diagnoses:  Proctocolitis    Rx / DC Orders ED Discharge Orders          Ordered    dicyclomine (BENTYL) 20 MG tablet  3 times daily before meals        06/06/23 1501    ciprofloxacin (CIPRO) 500 MG tablet  Every 12 hours        06/06/23 1501    metroNIDAZOLE (FLAGYL) 500 MG tablet  2 times daily        06/06/23 1501              Pricilla Loveless, MD 06/06/23 1514

## 2023-06-06 NOTE — ED Notes (Signed)
Patient transported to CT 

## 2023-06-06 NOTE — Telephone Encounter (Signed)
Dr. Criss Alvine in [place] long ED contacted me today to discuss patient.  Abigail Payne came to the emergency department with 4 days of abdominal pain and diarrhea with blood.  Initially she had fevers but that has resolved.   Workup notable for:   --normal white count, normal hemoglobin ( in normal range but down ~ 1.5 grams from baseline), normal electrolytes.   --CT AP with contrast Wall thickening in the sigmoid colon and rectum with surrounding inflammatory mesenteric stranding. Findings worrisome for proctocolitis, which may be infectious or inflammatory in nature.  Plan:  Seems reasonable to discharge home from ED with Cipro and Flagyl x 7 days plus dicyclomine for pain as needed.  Discussed case with Dr. Russella Dar ( covering [facility]).   Patient's primary GI is Dr. Lavon Paganini.  I will ask her nurse to contact the patient in 1 to 2 days to get a condition update.  Also to follow-up on the results of C. difficile and GI pathogen panel collected in the ED

## 2023-06-06 NOTE — ED Triage Notes (Signed)
Pt arrived via POV, c/o abd pain, diarrhea and headache for several days. Headache subsided after increase in fluid intake. Abd pain and diarrhea continued, not seeing faint blood in stool.

## 2023-06-06 NOTE — Discharge Instructions (Signed)
Your CT scan shows colon inflammation which is causing your diarrhea and abdominal pain.  You can take ibuprofen and Tylenol for pain and you are also being given a medicine called Bentyl for pain.  You are being given 2 different antibiotics to help control the infection.    The GI office will call you in the next 1-2 days to check on how you are doing.  However if you develop fever, worsening diarrhea, worsening pain, do not improve over the next 48 hours, or have any other new/concerning symptoms then return to the ER or call 911.

## 2023-06-07 LAB — GASTROINTESTINAL PANEL BY PCR, STOOL (REPLACES STOOL CULTURE)

## 2023-06-08 NOTE — Telephone Encounter (Signed)
Called the patient to get an update on her condition. No answer. Left a message asking she call with an update of if she is better, worse or the same.

## 2023-06-08 NOTE — Telephone Encounter (Signed)
Called the patient. No answer. Left some general information. Will call her again about scheduling the appointment.

## 2023-06-08 NOTE — Telephone Encounter (Signed)
Please advise patient to complete the course of Cipro and Flagyl as prescribed.  Continue with soft diet, she does not have to eat high carb diet she just has to make sure she does more soft diet for example cooking vegetables instead of raw vegetables.  Please schedule office visit with me or APP next available appointment.  Please advise her to call back if her symptoms do not improve within the next 4 to 5 days.  Thank you

## 2023-06-08 NOTE — Telephone Encounter (Signed)
Patient calls back. She reports improvement in her symptoms. Most of the diarrhea occurs at night. She has 2 to 3 loose stools during the night. In the waking hours she has 1 to 2 stools. She has added solid foods to her diet, but is not eating her normal diet yet. States "I am diabetic and I don't eat simple carbs." She denies any fevers since seen in the ER. She is tolerating the antibiotics Cipro and Flagyl. She is taking the dicyclomine 3 times daily. Patient endorses "feeling better." Stool culture was positive for Salmonella. Do you have any further recommendations?

## 2023-06-10 MED ORDER — DICYCLOMINE HCL 20 MG PO TABS: 20.0000 mg | ORAL_TABLET | Freq: Four times a day (QID) | ORAL | 0 refills | Status: DC | PRN

## 2023-06-10 NOTE — Addendum Note (Signed)
Addended by: Heber Renville A on: 06/10/2023 04:10 PM   Modules accepted: Orders

## 2023-06-10 NOTE — Telephone Encounter (Signed)
DOD Patient of Dr Lavon Paganini with recent ER visit 06/06/23 for diarrhea, fever and abdominal pain. GI was consulted. Patient was given a 7 day course of Cipro and Flagyl plus dicyclomine for pain as needed. Stool studies positive for salmonella. Follow up by phone 06/08/23, and the patient was beginning to feel better.  Patient calls today with complaints of a return of her symptoms. She was awakened repeatedly last night with diarrheal bowel movements. She does not see blood in the stools. Today she has had diarrhea as well and it "lasted for 20 or 30 minutes." She has taken dicyclomine twice due to the intense abdominal cramping.   Please advise.

## 2023-06-10 NOTE — Telephone Encounter (Signed)
Discussed with the patient. Options offered. Discussed hydration, medications and red flags for returning to the ED.

## 2023-06-10 NOTE — Telephone Encounter (Signed)
Bland diet with focus on hydration Continue antibiotics, but with salmonella she can stop the metronidazole and complete the cipro Dicyclomine can be used 20 mg every 4 hours as needed for cramps

## 2023-06-14 NOTE — Telephone Encounter (Signed)
Spoke with the patient. She feels she is better. "Almost normal" and she is back to work. She asks if she needs a colonoscopy sooner than the planned recall? If she is doing better, does she need an appointment in one of the 7 day hold spots?

## 2023-06-14 NOTE — Telephone Encounter (Signed)
If all her symptoms are resolved and her bowel habits have returned to baseline, can cancel the urgent appointment.  Please schedule nonurgent follow-up visit with me.  She had sigmoid wall thickening likely secondary to acute bacterial/Salmonella gastroenteritis, we can hold off scheduling colonoscopy at this point and I will discuss with patient during follow-up visit.  Thank you

## 2023-06-16 ENCOUNTER — Other Ambulatory Visit: Payer: Self-pay | Admitting: Nurse Practitioner

## 2023-06-16 DIAGNOSIS — E1169 Type 2 diabetes mellitus with other specified complication: Secondary | ICD-10-CM

## 2023-06-21 ENCOUNTER — Encounter: Payer: BC Managed Care – PPO | Admitting: Nurse Practitioner

## 2023-06-29 ENCOUNTER — Other Ambulatory Visit (HOSPITAL_COMMUNITY): Payer: Self-pay | Admitting: Internal Medicine

## 2023-06-29 DIAGNOSIS — Z23 Encounter for immunization: Secondary | ICD-10-CM | POA: Diagnosis not present

## 2023-06-29 DIAGNOSIS — F411 Generalized anxiety disorder: Secondary | ICD-10-CM | POA: Diagnosis not present

## 2023-06-29 DIAGNOSIS — I10 Essential (primary) hypertension: Secondary | ICD-10-CM | POA: Diagnosis not present

## 2023-06-29 DIAGNOSIS — E7800 Pure hypercholesterolemia, unspecified: Secondary | ICD-10-CM

## 2023-06-29 DIAGNOSIS — E118 Type 2 diabetes mellitus with unspecified complications: Secondary | ICD-10-CM | POA: Diagnosis not present

## 2023-07-07 ENCOUNTER — Ambulatory Visit (HOSPITAL_BASED_OUTPATIENT_CLINIC_OR_DEPARTMENT_OTHER)
Admission: RE | Admit: 2023-07-07 | Discharge: 2023-07-07 | Disposition: A | Discharge status: Home / Self Care | Payer: Self-pay | Source: Ambulatory Visit | Attending: Internal Medicine | Admitting: Internal Medicine

## 2023-07-07 DIAGNOSIS — E7800 Pure hypercholesterolemia, unspecified: Secondary | ICD-10-CM | POA: Insufficient documentation

## 2023-07-11 ENCOUNTER — Other Ambulatory Visit: Payer: Self-pay | Admitting: Nurse Practitioner

## 2023-07-11 DIAGNOSIS — E1169 Type 2 diabetes mellitus with other specified complication: Secondary | ICD-10-CM

## 2023-07-13 ENCOUNTER — Other Ambulatory Visit: Payer: Self-pay | Admitting: Nurse Practitioner

## 2023-07-13 DIAGNOSIS — E1159 Type 2 diabetes mellitus with other circulatory complications: Secondary | ICD-10-CM

## 2023-07-14 ENCOUNTER — Other Ambulatory Visit: Payer: Self-pay | Admitting: Nurse Practitioner

## 2023-07-14 DIAGNOSIS — E1169 Type 2 diabetes mellitus with other specified complication: Secondary | ICD-10-CM

## 2023-07-22 ENCOUNTER — Other Ambulatory Visit: Payer: BC Managed Care – PPO

## 2023-07-28 ENCOUNTER — Encounter: Payer: BC Managed Care – PPO | Admitting: Family Medicine

## 2023-08-04 ENCOUNTER — Other Ambulatory Visit: Payer: Self-pay | Admitting: Nurse Practitioner

## 2023-08-04 DIAGNOSIS — E1159 Type 2 diabetes mellitus with other circulatory complications: Secondary | ICD-10-CM

## 2023-08-26 DIAGNOSIS — D485 Neoplasm of uncertain behavior of skin: Secondary | ICD-10-CM | POA: Diagnosis not present

## 2023-08-26 DIAGNOSIS — B078 Other viral warts: Secondary | ICD-10-CM | POA: Diagnosis not present

## 2023-08-26 DIAGNOSIS — X32XXXA Exposure to sunlight, initial encounter: Secondary | ICD-10-CM | POA: Diagnosis not present

## 2023-08-26 DIAGNOSIS — Z1283 Encounter for screening for malignant neoplasm of skin: Secondary | ICD-10-CM | POA: Diagnosis not present

## 2023-08-26 DIAGNOSIS — D225 Melanocytic nevi of trunk: Secondary | ICD-10-CM | POA: Diagnosis not present

## 2023-08-26 DIAGNOSIS — L570 Actinic keratosis: Secondary | ICD-10-CM | POA: Diagnosis not present

## 2023-09-27 DIAGNOSIS — L988 Other specified disorders of the skin and subcutaneous tissue: Secondary | ICD-10-CM | POA: Diagnosis not present

## 2023-09-27 DIAGNOSIS — D485 Neoplasm of uncertain behavior of skin: Secondary | ICD-10-CM | POA: Diagnosis not present

## 2023-10-11 ENCOUNTER — Other Ambulatory Visit: Payer: Self-pay | Admitting: Internal Medicine

## 2023-10-11 DIAGNOSIS — Z1231 Encounter for screening mammogram for malignant neoplasm of breast: Secondary | ICD-10-CM

## 2023-10-27 DIAGNOSIS — R931 Abnormal findings on diagnostic imaging of heart and coronary circulation: Secondary | ICD-10-CM | POA: Diagnosis not present

## 2023-10-27 DIAGNOSIS — E1169 Type 2 diabetes mellitus with other specified complication: Secondary | ICD-10-CM | POA: Diagnosis not present

## 2023-10-27 DIAGNOSIS — E7800 Pure hypercholesterolemia, unspecified: Secondary | ICD-10-CM | POA: Diagnosis not present

## 2023-10-27 DIAGNOSIS — I10 Essential (primary) hypertension: Secondary | ICD-10-CM | POA: Diagnosis not present

## 2023-11-18 ENCOUNTER — Ambulatory Visit
Admission: RE | Admit: 2023-11-18 | Discharge: 2023-11-18 | Disposition: A | Discharge status: Home / Self Care | Payer: BC Managed Care – PPO | Source: Ambulatory Visit | Attending: Internal Medicine | Admitting: Internal Medicine

## 2023-11-18 DIAGNOSIS — Z1231 Encounter for screening mammogram for malignant neoplasm of breast: Secondary | ICD-10-CM | POA: Diagnosis not present

## 2023-12-06 DIAGNOSIS — R931 Abnormal findings on diagnostic imaging of heart and coronary circulation: Secondary | ICD-10-CM | POA: Diagnosis not present

## 2023-12-06 DIAGNOSIS — Z01419 Encounter for gynecological examination (general) (routine) without abnormal findings: Secondary | ICD-10-CM | POA: Diagnosis not present

## 2023-12-06 DIAGNOSIS — E7800 Pure hypercholesterolemia, unspecified: Secondary | ICD-10-CM | POA: Diagnosis not present

## 2023-12-06 DIAGNOSIS — Z8249 Family history of ischemic heart disease and other diseases of the circulatory system: Secondary | ICD-10-CM | POA: Diagnosis not present

## 2023-12-20 DIAGNOSIS — E119 Type 2 diabetes mellitus without complications: Secondary | ICD-10-CM | POA: Diagnosis not present

## 2023-12-20 DIAGNOSIS — H2513 Age-related nuclear cataract, bilateral: Secondary | ICD-10-CM | POA: Diagnosis not present

## 2023-12-21 ENCOUNTER — Encounter (HOSPITAL_BASED_OUTPATIENT_CLINIC_OR_DEPARTMENT_OTHER): Payer: Self-pay | Admitting: Nurse Practitioner

## 2023-12-21 ENCOUNTER — Telehealth: Payer: Self-pay | Admitting: Pharmacy Technician

## 2023-12-21 ENCOUNTER — Other Ambulatory Visit (HOSPITAL_COMMUNITY): Payer: Self-pay

## 2023-12-21 ENCOUNTER — Ambulatory Visit (HOSPITAL_BASED_OUTPATIENT_CLINIC_OR_DEPARTMENT_OTHER): Payer: BC Managed Care – PPO | Admitting: Nurse Practitioner

## 2023-12-21 VITALS — BP 84/60 | HR 60 | Ht 63.0 in | Wt 155.0 lb

## 2023-12-21 DIAGNOSIS — E1159 Type 2 diabetes mellitus with other circulatory complications: Secondary | ICD-10-CM

## 2023-12-21 DIAGNOSIS — E785 Hyperlipidemia, unspecified: Secondary | ICD-10-CM

## 2023-12-21 DIAGNOSIS — I2510 Atherosclerotic heart disease of native coronary artery without angina pectoris: Secondary | ICD-10-CM | POA: Diagnosis not present

## 2023-12-21 DIAGNOSIS — Z720 Tobacco use: Secondary | ICD-10-CM

## 2023-12-21 DIAGNOSIS — R931 Abnormal findings on diagnostic imaging of heart and coronary circulation: Secondary | ICD-10-CM

## 2023-12-21 DIAGNOSIS — E7841 Elevated Lipoprotein(a): Secondary | ICD-10-CM

## 2023-12-21 DIAGNOSIS — I152 Hypertension secondary to endocrine disorders: Secondary | ICD-10-CM

## 2023-12-21 DIAGNOSIS — Z7189 Other specified counseling: Secondary | ICD-10-CM

## 2023-12-21 MED ORDER — REPATHA SURECLICK 140 MG/ML ~~LOC~~ SOAJ: 140.0000 mg | SUBCUTANEOUS | 3 refills | Status: DC

## 2023-12-21 MED ORDER — REPATHA SURECLICK 140 MG/ML ~~LOC~~ SOAJ: 140.0000 mg | SUBCUTANEOUS | 3 refills | Status: AC

## 2023-12-21 MED ORDER — METOPROLOL TARTRATE 50 MG PO TABS: ORAL_TABLET | ORAL | 0 refills | Status: DC

## 2023-12-21 NOTE — Progress Notes (Signed)
 Cardiology Office Note:  .   Date:  12/21/2023 ID:  Abigail Payne, DOB 12-Jan-1966, MRN [id number] PCP: Clarice Nottingham, MD [facility] Providers Cardiologist:  None   Patient Profile: .      PMH Coronary artery disease CT Calcium  score 07/07/2023 CAC score 477 (99th percentile) LM 0, LAD 341, LCx 128, RCA 7.9 Type 2 DM Family history CAD Father had heart disease, died age 58 following a hospitalization Hyperlipidemia Hypertension Vapes nicotine       History of Present Illness: .   Abigail Payne is a very pleasant 58 y.o. female  who is here today for new patient consult for elevated coronary calcium  score.  She has a family history of heart disease, diabetes, and hypertension and had CT calcium  score for risk stratification as well as testing of LP(a) by PCP.  LP(a) was elevated at 174 and her CAC score is 477, placing her in the 99th percentile. She has a sedentary job but tries to stay active by walking 20-30 minutes 5-6 days a week and doing weights. She has been eating a very low carb and low sugar diet for several years due to uncontrolled diabetes. She admits to averaging three Ultra light beers a night, five times a week. She also vapes nicotine. Is currently on atorvastatin  and ezetimibe for cholesterol management. Most recent LDL-C was 46. She denies chest pain, shortness of breath, palpitations, presyncope, syncope, orthopnea, PND, edema, particularly when exercising.   Family history: Her family history includes Diabetes in her father and mother; Heart attack in her father; Hyperlipidemia in her father and mother; Hypertension in her father and mother.   ASCVD Risk Score: ASCVD (Atherosclerotic Cardiovascular Disease) 2013 Risk Calculator from AHA/ACC from [email or web address]  on 12/21/2023 ** All calculations should be rechecked by clinician prior to use **  RESULT SUMMARY:   Moderate-intensity statin recommended because of known diabetes but 10-year risk <7.5%. If LDL  70mg /dl (8.18 mmol/L), additional factors like lifestyle and risk-benefits can be considered before starting statins.  To view statin dosages by intensity, see Evidence section.  4.3%   Risk of cardiovascular event (coronary or stroke death or non-fatal MI or stroke) in next 10 years.  1.7%   10-year cardiovascular risk if risk factors were optimal.  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         CT Calcium  Score 07/07/23  Coronary arteries: Normal origins. Coronary Calcium  Score: Left main: 0 Left anterior descending artery: 341 Left circumflex artery: 128 Right coronary artery: 7.9 Total: 477   Percentile: 99th   Pericardium: Normal.   Ascending Aorta: Normal caliber.   EKG: normal sinus rhythm at 86 bpm, no ST abnormality   Risk Assessment/Calculations:             Physical Exam:  VS: BP (!) 84/60   Pulse 60   Ht 5' 3 (1.6 m)   Wt 155 lb (70.3 kg)   SpO2 93%   BMI 27.46 kg/m   Wt Readings from Last 3 Encounters:  12/21/23 155 lb (70.3 kg)  12/07/22 168 lb (76.2 kg)  08/05/22 163 lb 0.8 oz (74 kg)     GEN: Well nourished, well developed in no acute distress NECK: No JVD; No carotid bruits CARDIAC: RRR, no murmurs, rubs, gallops RESPIRATORY:  Clear to auscultation without rales, wheezing or rhonchi  ABDOMEN: Soft, non-tender, non-distended EXTREMITIES:  No edema; No deformity      ASSESSMENT AND PLAN: .    Coronary artery disease: CT calcium  score completed 07/07/2023 of 477 (99th percentile) with bulk of calcium  being in LAD, smaller amount in LCx and RCA.  She has additional risk factors of elevated LP(a) , diabetes, hyperlipidemia, and hypertension as well as significant family history. We will get coronary CTA to evaluate for ischemia.  Will have her take Lopressor  50 mg 2 hours prior to the test.  LDL is well-controlled, but we will see if she qualifies for PCSK9 inhibitor therapy in the setting of elevated LP(a).  Continue lipid-lowering therapy as well as antihypertensive combination therapy. She is also on empagliflozin  for diabetes.   Hypertension: BP is soft today. She is asymptomatic. Reports it tends to run low if she has not eaten. She has a protein bar to eat now. Continue antihypertensive therapy including olmesartan -amlodipine -hydrochlorothiazide combination tablet.   Elevated LP(a): LP(a) is elevated at 174.8.  We discussed that this is an additional risk factor for stroke and heart attack.  LDL cholesterol is well-controlled on ezetimibe and atorvastatin .  However in the setting of elevated LP(a) and additional risk factors for ASCVD, we will attempt to get Repatha  covered by insurance. She understands that it will likely only provide ~ 20-30% reduction. She is considering entering a clinical trial for patients with high LP(a) discussed by PCP.  Advised her to continue atorvastatin  and ezetimibe for now.  Consider additional therapy if available in the future.   Hyperlipidemia LDL goal < 70: Direct LDL measurement of 46 on 12/06/2023.  She is tolerating atorvastatin  and ezetimibe without any concerning side effects.  As noted above, we will attempt to get PCSK9 inhibitor therapy covered by insurance but she is aware she may not get approval with LDL being below goal.  Continue atorvastatin  and ezetimibe.  CV Risk Assessment: ASCVD risk score of 4.3%.  She has  additional risk factors of hyperlipidemia, smoking, and hypertension.  Hypertension and diabetes have been well-controlled.  She is smoking a nicotine vape and was encouraged to quit.  We are getting coronary CT for evaluation of ischemia.  Vaping: She is currently vaping nicotine product.  Complete cessation advised.   Plan/Goals: 1: Limit alcohol to no more than 1 standard drink daily 2: Quit vaping 3: Continue heart healthy diet and regular exercise regimen        Disposition: 2-3 months with me  Signed, Rosaline Bane, NP-C

## 2023-12-21 NOTE — Telephone Encounter (Signed)
 Pharmacy Patient Advocate Encounter  Received notification from [facility] that Prior Authorization for repatha  has been APPROVED from 12/21/23 to 12/20/24. Ran test claim, Copay is $105.00 3 months. This test claim was processed through [facility]- copay amounts may vary at other pharmacies due to pharmacy/plan contracts, or as the patient moves through the different stages of their insurance plan.   PA #/Case ID/Reference #: [id number]

## 2023-12-21 NOTE — Patient Instructions (Signed)
 Medication Instructions:   START Repatha  Inject ( 140 mg ) into skin every 14 days.   *If you need a refill on your cardiac medications before your next appointment, please call your pharmacy*   Lab Work:  None ordered.  If you have labs (blood work) drawn today and your tests are completely normal, you will receive your results only by: MyChart Message (if you have MyChart) OR A paper copy in the mail If you have any lab test that is abnormal or we need to change your treatment, we will call you to review the results.   Testing/Procedures:    Your cardiac CT will be scheduled at one of the below locations:   [facility] [address] [place], [place] [address] [phone]    If scheduled at [facility], please arrive 15 mins early for check-in and test prep.  There is spacious parking and easy access to the radiology department from the [facility] entrance. Please enter here and check-in with the desk attendant.   Please follow these instructions carefully (unless otherwise directed):  An IV will be required for this test and Nitroglycerin  will be given.   On the Night Before the Test: Be sure to Drink plenty of water. Do not consume any caffeinated/decaffeinated beverages or chocolate 12 hours prior to your test. Do not take any antihistamines 12 hours prior to your test.  On the Day of the Test: Drink plenty of water until 1 hour prior to the test. Do not eat any food 1 hour prior to test. You may take your regular medications prior to the test.  Take metoprolol  (Lopressor )one tablet ( 50 mg) by mouth  two hours prior to test. FEMALES- please wear underwire-free bra if available, avoid dresses & tight clothing     After the Test: Drink plenty of water. After receiving IV contrast, you may experience a mild flushed feeling. This is normal. On occasion, you may experience a mild rash up to 24 hours  after the test. This is not dangerous. If this occurs, you can take Benadryl 25 mg, Zyrtec, Claritin, or Allegra and increase your fluid intake. (Patients taking Tikosyn should avoid Benadryl, and may take Zyrtec, Claritin, or Allegra) If you experience trouble breathing, this can be serious. If it is severe call 911 IMMEDIATELY. If it is mild, please call our office.  We will call to schedule your test 2-4 weeks out understanding that some insurance companies will need an authorization prior to the service being performed.   For more information and frequently asked questions, please visit our website : [email or web address]  For non-scheduling related questions, please contact the cardiac imaging nurse navigator should you have any questions/concerns: Cardiac Imaging Nurse Navigators Direct Office Dial: [phone]   For scheduling needs, including cancellations and rescheduling, please call Brittany, [phone].    Follow-Up: At [facility], you and your health needs are our priority.  As part of our continuing mission to provide you with exceptional heart care, we have created designated Provider Care Teams.  These Care Teams include your primary Cardiologist (physician) and Advanced Practice Providers (APPs -  Physician Assistants and Nurse Practitioners) who all work together to provide you with the care you need, when you need it.  We recommend signing up for the patient portal called MyChart.  Sign up information is provided on this After Visit Summary.  MyChart is used to connect with patients for Virtual Visits (Telemedicine).  Patients are able to view lab/test results, encounter notes, upcoming appointments, etc.  Non-urgent messages can be sent to your provider as well.   To learn more about what you can do with MyChart, go to [email or web address].    Your next appointment:   3 month(s)  Provider:   Rosaline Bane, NP    Other Instructions  Pt  given reading material today on cholesterol options.

## 2023-12-21 NOTE — Addendum Note (Signed)
 Addended by: Marchella Hibbard L on: 12/21/2023 01:44 PM   Modules accepted: Orders

## 2023-12-21 NOTE — Telephone Encounter (Signed)
 Pharmacy Patient Advocate Encounter   Received notification from CoverMyMeds that prior authorization for repatha  is required/requested.   Insurance verification completed.   The patient is insured through [facility] .   Per test claim: PA required; PA submitted to above mentioned insurance via CoverMyMeds Key/confirmation #/EOC [id number] Status is pending

## 2023-12-30 ENCOUNTER — Telehealth (HOSPITAL_COMMUNITY): Payer: Self-pay | Admitting: *Deleted

## 2023-12-30 NOTE — Telephone Encounter (Signed)
Attempted to call patient regarding upcoming cardiac CT appointment. Left message on voicemail with name and callback number [name] RN Navigator Cardiac Imaging [facility] [phone] Office

## 2023-12-30 NOTE — Telephone Encounter (Signed)
Reaching out to patient to offer assistance regarding upcoming cardiac imaging study; pt verbalizes understanding of appt date/time, parking situation and where to check in, pre-test NPO status and medications ordered, and verified current allergies; name and call back number provided for further questions should they arise Abigail Frame RN Navigator Cardiac Imaging [facility] [phone] office [phone] cell

## 2024-01-02 ENCOUNTER — Ambulatory Visit
Admission: RE | Admit: 2024-01-02 | Discharge: 2024-01-02 | Disposition: A | Discharge status: Home / Self Care | Payer: BC Managed Care – PPO | Source: Ambulatory Visit | Attending: Nurse Practitioner | Admitting: Nurse Practitioner

## 2024-01-02 DIAGNOSIS — R931 Abnormal findings on diagnostic imaging of heart and coronary circulation: Secondary | ICD-10-CM | POA: Insufficient documentation

## 2024-01-02 DIAGNOSIS — E1159 Type 2 diabetes mellitus with other circulatory complications: Secondary | ICD-10-CM | POA: Diagnosis not present

## 2024-01-02 DIAGNOSIS — I152 Hypertension secondary to endocrine disorders: Secondary | ICD-10-CM | POA: Diagnosis not present

## 2024-01-02 DIAGNOSIS — E7841 Elevated Lipoprotein(a): Secondary | ICD-10-CM | POA: Diagnosis not present

## 2024-01-02 MED ORDER — NITROGLYCERIN 0.4 MG SL SUBL
0.8000 mg | SUBLINGUAL_TABLET | Freq: Once | SUBLINGUAL | Status: AC
  Administered 2024-01-02: 0.4 mg via SUBLINGUAL
  Filled 2024-01-02: qty 25

## 2024-01-02 MED ORDER — IOHEXOL 350 MG/ML SOLN
80.0000 mL | Freq: Once | INTRAVENOUS | Status: AC | PRN
  Administered 2024-01-02: 80 mL via INTRAVENOUS

## 2024-01-02 MED ORDER — METOPROLOL TARTRATE 5 MG/5ML IV SOLN: 10.0000 mg | Freq: Once | INTRAVENOUS | Status: DC | PRN

## 2024-01-02 MED ORDER — DILTIAZEM HCL 25 MG/5ML IV SOLN: 10.0000 mg | INTRAVENOUS | Status: DC | PRN

## 2024-01-02 NOTE — Progress Notes (Signed)
Patient presents for a cardiac CT.  BP 105/70 HR 65 on arrival.  Per Dr. Azucena Cecil, proceed with CT with one nitroglycerin.  Patient maintained acceptable vital signs, denies symptoms.  Patient ambulated out of department with a steady gait.

## 2024-01-03 ENCOUNTER — Encounter (HOSPITAL_BASED_OUTPATIENT_CLINIC_OR_DEPARTMENT_OTHER): Payer: Self-pay

## 2024-01-20 DIAGNOSIS — D225 Melanocytic nevi of trunk: Secondary | ICD-10-CM | POA: Diagnosis not present

## 2024-01-20 DIAGNOSIS — B078 Other viral warts: Secondary | ICD-10-CM | POA: Diagnosis not present

## 2024-01-20 DIAGNOSIS — Z1283 Encounter for screening for malignant neoplasm of skin: Secondary | ICD-10-CM | POA: Diagnosis not present

## 2024-03-14 NOTE — Progress Notes (Signed)
 Cardiology Office Note:  .   Date:  03/15/2024 ID:  Abigail Payne, DOB 12-27-1965, MRN [id number] PCP: Abigail Man, MD [facility] Providers Cardiologist:  None   Patient Profile: .      PMH Coronary artery disease CT Calcium  score 07/07/2023 CAC score 477 (99th percentile) LM 0, LAD 341, LCx 128, RCA 7.9 Type 2 DM Family history CAD Father had heart disease, died age 58 following a hospitalization Hyperlipidemia Hypertension Vapes nicotine Elevated lipoprotein a   Referred to cardiology as new patient for elevated coronary calcium  score and seen by me on 12/21/23. Family history of heart disease, diabetes, and hypertension who had CT calcium  score for risk stratification as well as testing of LP(a) by PCP.  LP(a) was elevated at 174 and her CAC score is 477, placing her in the 99th percentile. She has a sedentary job but tries to stay active by walking 20-30 minutes 5-6 days a week and doing weights. She has been eating a very low carb and low sugar diet for several years due to uncontrolled diabetes. She admits to averaging three Ultra light beers a night, five times a week. She also vapes nicotine. Is currently on atorvastatin  and ezetimibe for cholesterol management. Most recent LDL-C was 46. She was not having chest pain, shortness of breath, palpitations, presyncope, syncope, orthopnea, PND, edema.  ASCVD Risk score was 4.3%. Due to risk factors of hypertension, diabetes, and hyperlipidemia, coronary CTA was ordered for evaluation of ischemia.  She has mild proximal LAD stenosis (25 to 49%) and minimal proximal LCx stenosis (<25%).  BP was soft but she had not eaten at the time of visit.  Due to elevated LP(a), she was started on Repatha  140 mg every 2 weeks in addition to atorvastatin  and ezetimibe.        History of Present Illness: .    History of Present Illness Abigail Payne is a very pleasant 58 year old female with coronary artery disease who presents for  follow-up of elevated coronary calcium  score. We reviewed her coronary CTA results which revealed calcified plaque in the mid left anterior descending artery with 29-49% stenosis and less than 25% stenosis in the left circumflex artery. She denies chest pain, dyspnea, peripheral edema, orthopnea, PND, presyncope, syncope, or palpitations. She is tolerating Repatha  with no adverse effects from the medication, but she finds the injector difficult to use. Her daughter is helping her inject into her abdomen. She remains active with regular walking, light weight lifting with five-pound weights, and swimming in the summer. She maintains a healthy diet and experiences but unfortunately continues to vape. She has discontinued nicotine in her vape but admits it is a difficult habit to stop. BP remains soft but she is asymptomatic.    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         CT Calcium  Score 07/07/23  Coronary arteries: Normal origins. Coronary Calcium  Score: Left main: 0 Left anterior descending artery: 341 Left circumflex artery: 128 Right coronary artery: 7.9 Total: 477   Percentile: 99th Pericardium: Normal. Ascending Aorta: Normal caliber.  Risk Assessment/Calculations:             Physical Exam:   VS: BP (!) 86/60   Pulse 94   Ht 5' 3.5" (1.613 m)   Wt 150 lb 9.6 oz (68.3 kg)   SpO2 98%   BMI 26.26 kg/m   Wt Readings from Last 3 Encounters:  03/15/24 150 lb 9.6 oz (68.3 kg)  12/21/23 155 lb (70.3 kg)  12/07/22 168 lb (76.2 kg)     GEN: Well nourished, well developed in no acute distress NECK: No JVD; No carotid bruits CARDIAC: RRR, no murmurs, rubs,  gallops RESPIRATORY:  Clear to auscultation without rales, wheezing or rhonchi  ABDOMEN: Soft, non-tender, non-distended EXTREMITIES:  No edema; No deformity     ASSESSMENT AND PLAN: .    Coronary artery disease: CT calcium  score completed 07/07/2023 of 477 (99th percentile) with bulk of calcium  being in LAD, smaller amount in LCx and RCA. Due to additional risk factors of elevated LP(a) , diabetes, hyperlipidemia, and hypertension, we got coronary CTA to evaluate for ischemia. CCTA 01/02/24 revealed less than 25% calcified plaque in LCx and 25 to 49% calcified plaque in proximal LAD. She denies chest pain, dyspnea, or other symptoms concerning for angina.  No indication for further ischemic evaluation at this time. Encouraged her to continue to focus on secondary prevention including heart healthy mostly plant based diet avoiding saturated fat, processed foods, simple carbohydrates, and sugar along with aiming for at least 150 minutes of moderate intensity exercise each week. Advised that she start aspirin  81 mg daily. Continue atorvastatin , Repatha , olmesartan , amlodipine , ezetimibe.   Hypertension: BP remains soft today. She is on olmesartan -amlodipine -hydrochlorothiazide 20-5-12.5 mg daily. She is asymptomatic and reports no concerns for lightheadedness, presyncope, or syncope. No change in anti-hypertensive therapy today.   Hyperlipidemia LDL goal < 70/Elevated LP(a): LP(a) elevated at 174.8.  Lipid panel needed 06/29/2023 with total cholesterol 188, HDL 94, LDL-C 79, and triglycerides 86.  She was started on the due to elevated LP(a).  She was already on atorvastatin  and ezetimibe without any concerning side effects.  Reports some difficulty with administrating Repatha  but she has gotten used to it.  No concerning side effects.  We will recheck lipid panel today since she has had at least 4 injections.  Continue ezetimibe, atorvastatin , Repatha .  Vaping: She continues to vape, but vapes 0% nicotine  product. Complete cessation advised.  Disposition: 1 year with Dr. Theodis Fiscal or APP  Signed, Slater Duncan, NP-C

## 2024-03-15 ENCOUNTER — Ambulatory Visit (HOSPITAL_BASED_OUTPATIENT_CLINIC_OR_DEPARTMENT_OTHER): Payer: BC Managed Care – PPO | Admitting: Nurse Practitioner

## 2024-03-15 ENCOUNTER — Encounter (HOSPITAL_BASED_OUTPATIENT_CLINIC_OR_DEPARTMENT_OTHER): Payer: Self-pay | Admitting: Nurse Practitioner

## 2024-03-15 ENCOUNTER — Encounter (HOSPITAL_BASED_OUTPATIENT_CLINIC_OR_DEPARTMENT_OTHER): Payer: Self-pay

## 2024-03-15 VITALS — BP 86/60 | HR 94 | Ht 63.5 in | Wt 150.6 lb

## 2024-03-15 DIAGNOSIS — I2510 Atherosclerotic heart disease of native coronary artery without angina pectoris: Secondary | ICD-10-CM

## 2024-03-15 DIAGNOSIS — I152 Hypertension secondary to endocrine disorders: Secondary | ICD-10-CM

## 2024-03-15 DIAGNOSIS — E7841 Elevated Lipoprotein(a): Secondary | ICD-10-CM

## 2024-03-15 DIAGNOSIS — E1159 Type 2 diabetes mellitus with other circulatory complications: Secondary | ICD-10-CM

## 2024-03-15 DIAGNOSIS — R931 Abnormal findings on diagnostic imaging of heart and coronary circulation: Secondary | ICD-10-CM

## 2024-03-15 DIAGNOSIS — Z7289 Other problems related to lifestyle: Secondary | ICD-10-CM

## 2024-03-15 MED ORDER — ASPIRIN 81 MG PO TBEC: 81.0000 mg | DELAYED_RELEASE_TABLET | Freq: Every day | ORAL | Status: AC

## 2024-03-15 NOTE — Patient Instructions (Signed)
 Medication Instructions:   Your physician recommends that you continue on your current medications as directed. Please refer to the Current Medication list given to you today.   *If you need a refill on your cardiac medications before your next appointment, please call your pharmacy*  Lab Work:  TODAY!!! LPA/NMR  If you have labs (blood work) drawn today and your tests are completely normal, you will receive your results only by: MyChart Message (if you have MyChart) OR A paper copy in the mail If you have any lab test that is abnormal or we need to change your treatment, we will call you to review the results.  Testing/Procedures:  None ordered.  Follow-Up: At [facility], you and your health needs are our priority.  As part of our continuing mission to provide you with exceptional heart care, our providers are all part of one team.  This team includes your primary Cardiologist (physician) and Advanced Practice Providers or APPs (Physician Assistants and Nurse Practitioners) who all work together to provide you with the care you need, when you need it.  Your next appointment:   1 year(s)  Provider:   Maudine Sos, MD, Slater Duncan, NP, or Neomi Banks, NP    We recommend signing up for the patient portal called "MyChart".  Sign up information is provided on this After Visit Summary.  MyChart is used to connect with patients for Virtual Visits (Telemedicine).  Patients are able to view lab/test results, encounter notes, upcoming appointments, etc.  Non-urgent messages can be sent to your provider as well.   To learn more about what you can do with MyChart, go to [email or web address].   Other Instructions  Your physician wants you to follow-up in: 1 year.  You will receive a reminder letter in the mail two months in advance. If you don't receive a letter, please call our office to schedule the follow-up appointment.

## 2024-03-16 LAB — NMR, LIPOPROFILE
Cholesterol, Total: 113 mg/dL (ref 100–199)
HDL Particle Number: 34 umol/L (ref >=30.5)
HDL-C: 86 mg/dL (ref >39)
LDL Particle Number: <300 nmol/L (ref <1000)
LDL-C (NIH Calc): 17 mg/dL (ref 0–99)
LP-IR Score: <25 (ref <=45)
Small LDL Particle Number: <90 nmol/L (ref <=527)
Triglycerides: 35 mg/dL (ref 0–149)

## 2024-03-16 LAB — LIPOPROTEIN A (LPA): Lipoprotein (a): 117.2 nmol/L — ABNORMAL HIGH (ref <75.0)

## 2024-03-17 ENCOUNTER — Encounter (HOSPITAL_BASED_OUTPATIENT_CLINIC_OR_DEPARTMENT_OTHER): Payer: Self-pay

## 2024-03-19 ENCOUNTER — Other Ambulatory Visit (HOSPITAL_BASED_OUTPATIENT_CLINIC_OR_DEPARTMENT_OTHER): Payer: Self-pay | Admitting: *Deleted

## 2024-03-19 ENCOUNTER — Encounter (HOSPITAL_BASED_OUTPATIENT_CLINIC_OR_DEPARTMENT_OTHER): Payer: Self-pay

## 2024-03-19 DIAGNOSIS — I2510 Atherosclerotic heart disease of native coronary artery without angina pectoris: Secondary | ICD-10-CM

## 2024-03-19 DIAGNOSIS — R931 Abnormal findings on diagnostic imaging of heart and coronary circulation: Secondary | ICD-10-CM

## 2024-03-19 DIAGNOSIS — E7841 Elevated Lipoprotein(a): Secondary | ICD-10-CM

## 2024-05-17 DIAGNOSIS — F422 Mixed obsessional thoughts and acts: Secondary | ICD-10-CM | POA: Diagnosis not present

## 2024-05-17 DIAGNOSIS — F411 Generalized anxiety disorder: Secondary | ICD-10-CM | POA: Diagnosis not present

## 2024-06-19 DIAGNOSIS — M79672 Pain in left foot: Secondary | ICD-10-CM | POA: Diagnosis not present

## 2024-06-28 DIAGNOSIS — I10 Essential (primary) hypertension: Secondary | ICD-10-CM | POA: Diagnosis not present

## 2024-06-28 DIAGNOSIS — E1169 Type 2 diabetes mellitus with other specified complication: Secondary | ICD-10-CM | POA: Diagnosis not present

## 2024-06-28 DIAGNOSIS — Z0000 Encounter for general adult medical examination without abnormal findings: Secondary | ICD-10-CM | POA: Diagnosis not present

## 2024-07-03 DIAGNOSIS — F411 Generalized anxiety disorder: Secondary | ICD-10-CM | POA: Diagnosis not present

## 2024-07-03 DIAGNOSIS — E7800 Pure hypercholesterolemia, unspecified: Secondary | ICD-10-CM | POA: Diagnosis not present

## 2024-07-03 DIAGNOSIS — E118 Type 2 diabetes mellitus with unspecified complications: Secondary | ICD-10-CM | POA: Diagnosis not present

## 2024-07-03 DIAGNOSIS — I10 Essential (primary) hypertension: Secondary | ICD-10-CM | POA: Diagnosis not present

## 2024-07-03 DIAGNOSIS — Z0000 Encounter for general adult medical examination without abnormal findings: Secondary | ICD-10-CM | POA: Diagnosis not present

## 2024-07-27 DIAGNOSIS — Z1283 Encounter for screening for malignant neoplasm of skin: Secondary | ICD-10-CM | POA: Diagnosis not present

## 2024-07-27 DIAGNOSIS — B078 Other viral warts: Secondary | ICD-10-CM | POA: Diagnosis not present

## 2024-07-27 DIAGNOSIS — D225 Melanocytic nevi of trunk: Secondary | ICD-10-CM | POA: Diagnosis not present

## 2024-12-19 ENCOUNTER — Other Ambulatory Visit: Payer: Self-pay | Admitting: Internal Medicine

## 2024-12-19 DIAGNOSIS — Z1231 Encounter for screening mammogram for malignant neoplasm of breast: Secondary | ICD-10-CM

## 2024-12-28 ENCOUNTER — Ambulatory Visit
